# Patient Record
Sex: Female | Born: 1965 | Race: White | Hispanic: No | Marital: Married | Smoking: Never smoker
Health system: Southern US, Community
[De-identification: ages and names within clinical notes are randomized; demographics above are authoritative.]

## PROBLEM LIST (undated history)

## (undated) DIAGNOSIS — J45909 Unspecified asthma, uncomplicated: Secondary | ICD-10-CM

## (undated) DIAGNOSIS — F329 Major depressive disorder, single episode, unspecified: Secondary | ICD-10-CM

## (undated) DIAGNOSIS — R011 Cardiac murmur, unspecified: Secondary | ICD-10-CM

## (undated) DIAGNOSIS — J439 Emphysema, unspecified: Secondary | ICD-10-CM

## (undated) DIAGNOSIS — T7840XA Allergy, unspecified, initial encounter: Secondary | ICD-10-CM

## (undated) DIAGNOSIS — F32A Depression, unspecified: Secondary | ICD-10-CM

## (undated) DIAGNOSIS — E079 Disorder of thyroid, unspecified: Secondary | ICD-10-CM

## (undated) DIAGNOSIS — E785 Hyperlipidemia, unspecified: Secondary | ICD-10-CM

## (undated) DIAGNOSIS — R32 Unspecified urinary incontinence: Secondary | ICD-10-CM

## (undated) HISTORY — DX: Unspecified urinary incontinence: R32

## (undated) HISTORY — DX: Disorder of thyroid, unspecified: E07.9

## (undated) HISTORY — DX: Allergy, unspecified, initial encounter: T78.40XA

## (undated) HISTORY — DX: Hyperlipidemia, unspecified: E78.5

## (undated) HISTORY — DX: Major depressive disorder, single episode, unspecified: F32.9

## (undated) HISTORY — DX: Cardiac murmur, unspecified: R01.1

## (undated) HISTORY — DX: Unspecified asthma, uncomplicated: J45.909

## (undated) HISTORY — DX: Depression, unspecified: F32.A

## (undated) HISTORY — DX: Emphysema, unspecified: J43.9

---

## 2008-06-06 ENCOUNTER — Inpatient Hospital Stay: Payer: Self-pay

## 2009-03-14 ENCOUNTER — Encounter: Payer: Self-pay | Admitting: Maternal and Fetal Medicine

## 2009-03-31 ENCOUNTER — Encounter: Payer: Self-pay | Admitting: Maternal and Fetal Medicine

## 2009-06-27 ENCOUNTER — Observation Stay: Payer: Self-pay | Admitting: Obstetrics and Gynecology

## 2009-06-28 ENCOUNTER — Ambulatory Visit: Payer: Self-pay | Admitting: Obstetrics and Gynecology

## 2009-08-05 ENCOUNTER — Inpatient Hospital Stay: Payer: Self-pay

## 2009-08-31 HISTORY — PX: BREAST SURGERY: SHX581

## 2014-11-30 LAB — HM PAP SMEAR: HM PAP: NORMAL

## 2014-11-30 LAB — HM MAMMOGRAPHY: HM MAMMO: NORMAL

## 2014-12-18 LAB — HM PAP SMEAR: HM PAP: NEGATIVE

## 2015-08-05 ENCOUNTER — Ambulatory Visit: Payer: Self-pay | Admitting: Family Medicine

## 2015-08-08 ENCOUNTER — Encounter: Payer: Self-pay | Admitting: Family Medicine

## 2015-08-08 ENCOUNTER — Ambulatory Visit (INDEPENDENT_AMBULATORY_CARE_PROVIDER_SITE_OTHER): Payer: BLUE CROSS/BLUE SHIELD | Admitting: Family Medicine

## 2015-08-08 VITALS — BP 142/87 | HR 80 | Temp 98.6°F | Resp 16 | Ht 62.0 in | Wt 155.8 lb

## 2015-08-08 DIAGNOSIS — F419 Anxiety disorder, unspecified: Secondary | ICD-10-CM | POA: Insufficient documentation

## 2015-08-08 DIAGNOSIS — E559 Vitamin D deficiency, unspecified: Secondary | ICD-10-CM | POA: Diagnosis not present

## 2015-08-08 DIAGNOSIS — F331 Major depressive disorder, recurrent, moderate: Secondary | ICD-10-CM

## 2015-08-08 DIAGNOSIS — Z823 Family history of stroke: Secondary | ICD-10-CM

## 2015-08-08 DIAGNOSIS — R030 Elevated blood-pressure reading, without diagnosis of hypertension: Secondary | ICD-10-CM

## 2015-08-08 DIAGNOSIS — R002 Palpitations: Secondary | ICD-10-CM | POA: Insufficient documentation

## 2015-08-08 DIAGNOSIS — Z91048 Other nonmedicinal substance allergy status: Secondary | ICD-10-CM | POA: Diagnosis not present

## 2015-08-08 DIAGNOSIS — R6889 Other general symptoms and signs: Secondary | ICD-10-CM | POA: Diagnosis not present

## 2015-08-08 DIAGNOSIS — J4540 Moderate persistent asthma, uncomplicated: Secondary | ICD-10-CM | POA: Diagnosis not present

## 2015-08-08 DIAGNOSIS — F32A Depression, unspecified: Secondary | ICD-10-CM | POA: Insufficient documentation

## 2015-08-08 DIAGNOSIS — IMO0001 Reserved for inherently not codable concepts without codable children: Secondary | ICD-10-CM | POA: Insufficient documentation

## 2015-08-08 DIAGNOSIS — Z9109 Other allergy status, other than to drugs and biological substances: Secondary | ICD-10-CM | POA: Insufficient documentation

## 2015-08-08 MED ORDER — FLUTICASONE PROPIONATE HFA 220 MCG/ACT IN AERO: 2.0000 | INHALATION_SPRAY | Freq: Two times a day (BID) | RESPIRATORY_TRACT | Status: DC

## 2015-08-08 MED ORDER — PREDNISONE 20 MG PO TABS: 20.0000 mg | ORAL_TABLET | Freq: Every day | ORAL | Status: DC

## 2015-08-08 MED ORDER — SERTRALINE HCL 50 MG PO TABS: 50.0000 mg | ORAL_TABLET | Freq: Every day | ORAL | Status: DC

## 2015-08-08 MED ORDER — ALBUTEROL SULFATE HFA 108 (90 BASE) MCG/ACT IN AERS: 2.0000 | INHALATION_SPRAY | Freq: Four times a day (QID) | RESPIRATORY_TRACT | Status: DC | PRN

## 2015-08-08 NOTE — Assessment & Plan Note (Signed)
Continue nasacort and claritin. Suspect allergies causing asthma exacerbation. Recommend hepa filters at home. Avoid pets in the bedroom.  Consider referral to allergist.

## 2015-08-08 NOTE — Assessment & Plan Note (Signed)
Occasional. Cardiac work-up negative. If increasing- consider cardiology visit for addition work-up. Alarm symptoms reviewed.

## 2015-08-08 NOTE — Progress Notes (Signed)
Subjective:    Patient ID: Karen Martinez, female    DOB: 07/16/1966, 49 y.o.   MRN: [id number]  HPI: Karen Martinez is a 49 y.o. female presenting on 08/08/2015 for Establish Care   HPI  Pt presents to re-establish care today.Last seen here 2 years ago for an acute visit only. She was previously getting most care at  [facility].  It has been 6 months since Her last PCP visit. Records from previous provider will be requested and reviewed. Current medical problems include:  Seasonal allergies: Takes claritin daily. Controls most symptoms. Allergic to cats. Issues with pollen Breathing: No formal diagnosis of asthma Recently remodeling home- has been having issues with breathing. Daily inhaler use.  2 mos of coughing and chest tightness.  Issues began in October Depression: Low level dysthmia. Never been on medication for depression. Is reporting feeling down depressed or hopeless. Is reporting thoughts she would be better off dead. No active plan.  She thought these thoughts were normal.  She reports always having them. Never contemplated acting on thoughts.  Vitamin D deficiency: Taking 11914 per week 3 mos ago. Had low vitamin D in the pas.t  Back pain- Bulging disc- imaging done by chiropractor. Currently being treated by a chiropractor.  Heart arrhythmia:Started in March 2013 with palpitations- work up by [facility] cardiology. Had an Echo- done. Normal. PVCs found on exam. Has occasional palpitations Health maintenance:  Last mammogram April 2016- normal Last Pap APril 2016- normal Tdap: unsure Flu shot:   Past Medical History  Diagnosis Date  . Allergy   . Asthma   . Depression   . Emphysema of lung (HCC)   . Heart murmur     heart arrhythmias  . Hyperlipidemia   . Thyroid disease   . Urine incontinence    Social History   Social History  . Marital Status: Married    Spouse Name: N/A  . Number of Children: N/A  . Years of Education: N/A   Occupational History  . Not on  file.   Social History Main Topics  . Smoking status: Never Smoker   . Smokeless tobacco: Not on file  . Alcohol Use: Yes  . Drug Use: No  . Sexual Activity: Not on file   Other Topics Concern  . Not on file   Social History Narrative  . No narrative on file   Family History  Problem Relation Age of Onset  . Stroke Father   . Heart disease Father   . Cancer Father     facial   . Hyperlipidemia Father   . Cancer Maternal Grandfather   . Heart disease Maternal Grandfather   . Alcohol abuse Maternal Grandfather   . Alcohol abuse Paternal Grandfather    No current outpatient prescriptions on file prior to visit.   No current facility-administered medications on file prior to visit.    Review of Systems  Constitutional: Negative for fever and chills.  HENT: Positive for congestion and sneezing. Negative for nosebleeds, postnasal drip, sinus pressure and trouble swallowing.   Respiratory: Positive for cough, chest tightness and wheezing. Negative for shortness of breath.   Cardiovascular: Positive for palpitations (occasional. None currently. ). Negative for chest pain and leg swelling.  Endocrine: Positive for cold intolerance. Negative for polydipsia, polyphagia and polyuria.  Allergic/Immunologic: Positive for environmental allergies.  Neurological: Negative for dizziness, seizures and syncope.  Psychiatric/Behavioral: Positive for suicidal ideas (dark thoughts, no active plan. ) and dysphoric mood.  Per HPI unless specifically indicated above     Objective:    BP 142/87 mmHg  Pulse 80  Temp(Src) 98.6 F (37 C) (Oral)  Resp 16  Ht  (1.575 m)  Wt 155 lb 12.8 oz (70.67 kg)  BMI 28.49 kg/m2  LMP 06/16/2015  Wt Readings from Last 3 Encounters:  08/08/15 155 lb 12.8 oz (70.67 kg)    Physical Exam  Constitutional: She is oriented to person, place, and time. She appears well-developed and well-nourished.  HENT:  Head: Normocephalic and atraumatic.  Neck:  Neck supple.  Cardiovascular: Normal rate, regular rhythm and normal heart sounds.  Exam reveals no gallop and no friction rub.   No murmur heard. Pulmonary/Chest: Effort normal and breath sounds normal. She has no wheezes. She exhibits no tenderness.  Abdominal: Soft. Normal appearance and bowel sounds are normal. She exhibits no distension and no mass. There is no tenderness. There is no rebound and no guarding.  Musculoskeletal: Normal range of motion. She exhibits no edema or tenderness.  Lymphadenopathy:    She has no cervical adenopathy.  Neurological: She is alert and oriented to person, place, and time.  Skin: Skin is warm and dry.  Psychiatric: She has a normal mood and affect. Her speech is normal and behavior is normal. Judgment normal. Thought content is not paranoid. Cognition and memory are normal. She expresses no suicidal ideation. She expresses no suicidal plans and no homicidal plans.   Results for orders placed or performed in visit on 08/08/15  HM MAMMOGRAPHY  Result Value Ref Range   HM Mammogram normal   HM PAP SMEAR  Result Value Ref Range   HM Pap smear normal       Assessment & Plan:   Problem List Items Addressed This Visit      Respiratory   Moderate persistent asthma - Primary    Likely asthma exacerbation.  Cough variant asthma. Treat with PRN albuterol. Start controller medication.   CBC and IgE testing- suspect allergic component. Spirometry at next visit.  Short burst prednisone to treat for acute exacerbation today.  Spirometry needed next visit.       Relevant Medications   fluticasone (FLOVENT HFA) 220 MCG/ACT inhaler   albuterol (PROVENTIL HFA;VENTOLIN HFA) 108 (90 BASE) MCG/ACT inhaler   predniSONE (DELTASONE) 20 MG tablet   Other Relevant Orders   Comprehensive Metabolic Panel (CMET)   CBC with Differential   IgE     Other   Moderate recurrent major depression (HCC)    PHQ9 16/27- likely dysthimia considering continuous symptoms.  Start zoloft today.   Check TSH and Vitamin D. Dark thoughts:  Safety contract made. Pt aware medication may increase these thoughts- she will call the office right away if they occur. ER if danger to self. National Suicide prevention line given.       Relevant Medications   sertraline (ZOLOFT) 50 MG tablet   Vitamin D deficiency    Recheck vitamin D level.       Relevant Orders   VITAMIN D 25 Hydroxy (Vit-D Deficiency, Fractures)   Elevated BP    Recheck BP at home x 3 week. Follow-up visit for BP. DASH diet reviewed. Alarm symptoms reviewed.  CMP      Palpitations    Occasional. Cardiac work-up negative. If increasing- consider cardiology visit for addition work-up. Alarm symptoms reviewed.       Multiple environmental allergies    Continue nasacort and claritin. Suspect allergies causing asthma exacerbation. Recommend  hepa filters at home. Avoid pets in the bedroom.  Consider referral to allergist.        Other Visit Diagnoses    Cold intolerance        Check TSH.     Relevant Orders    TSH    Family history of stroke        Lipid panel to stratify risk factors    Relevant Orders    Lipid Profile       Meds ordered this encounter  Medications  . NUVARING 0.12-0.015 MG/24HR vaginal ring    Sig: INSERT A NUVARING INTO VAGINA, LEAVE IN PLACE 3 (THREE) WEEKS AND REMOVE FOR 1 WEEK.    Refill:  3  . vitamin C (ASCORBIC ACID) 500 MG tablet    Sig: Take 500 mg by mouth daily.  . calcium-vitamin D (OSCAL WITH D) 250-125 MG-UNIT tablet    Sig: Take 1 tablet by mouth daily.  . naproxen (NAPROSYN) 500 MG tablet    Sig: Take 500 mg by mouth 2 (two) times daily with a meal.  . loratadine (CLARITIN) 10 MG tablet    Sig: Take 10 mg by mouth daily.  Marland Kitchen. triamcinolone (NASACORT AQ) 55 MCG/ACT AERO nasal inhaler    Sig: Place 2 sprays into the nose daily.  . fluticasone (FLOVENT HFA) 220 MCG/ACT inhaler    Sig: Inhale 2 puffs into the lungs 2 (two) times daily.    Dispense:   1 Inhaler    Refill:  12    Order Specific Question:  Supervising Provider    Answer:  Janeann ForehandHAWKINS JR, JAMES H [phone][[id number]]  . albuterol (PROVENTIL HFA;VENTOLIN HFA) 108 (90 BASE) MCG/ACT inhaler    Sig: Inhale 2 puffs into the lungs every 6 (six) hours as needed for wheezing or shortness of breath.    Dispense:  1 Inhaler    Refill:  11    Order Specific Question:  Supervising Provider    Answer:  Janeann ForehandHAWKINS JR, JAMES H [phone][[id number]]  . predniSONE (DELTASONE) 20 MG tablet    Sig: Take 1 tablet (20 mg total) by mouth daily with breakfast.    Dispense:  10 tablet    Refill:  0    Order Specific Question:  Supervising Provider    Answer:  Janeann ForehandHAWKINS JR, JAMES H [phone][[id number]]  . sertraline (ZOLOFT) 50 MG tablet    Sig: Take 1 tablet (50 mg total) by mouth daily.    Dispense:  30 tablet    Refill:  3    Order Specific Question:  Supervising Provider    Answer:  Janeann ForehandHAWKINS JR, JAMES H [[id number]]      Follow up plan: Return in about 4 weeks (around 09/05/2015) for spirometry, depression, bp.

## 2015-08-08 NOTE — Patient Instructions (Signed)
Blood pressure: Check three times per week after sitting down for 5 minutes. We will follow-up in 1 month for your blood pressure.  Depression: Start Zoloft after you finish the prednisone. Break the first 2 tablets in 1/2 and take 1/2 pill for the first 4 days. Take this at bedtime. It takes a full month for the medication to take effect. We will follow-up in 1 month.  The Zoloft might increase your dark thoughts- if it does, please let me know. If you feel you are a danger to yourself, go straight to the ER.   You can also call  [phone]  Breathing: Take 5 days of prednisone to help with symptoms. Take albuterol PRN q4-6 hours. Start steroid inhaler twice daily. We will follow-up in 1 month and do a breathing test. Please bring your inhaler.  Please seek immediate medical attention if you develop shortness of breath not relieve by inhaler, chest pain/tightness, fever > 103 F or other concerning symptoms.

## 2015-08-08 NOTE — Assessment & Plan Note (Signed)
PHQ9 16/27- likely dysthimia considering continuous symptoms. Start zoloft today.   Check TSH and Vitamin D. Dark thoughts:  Safety contract made. Pt aware medication may increase these thoughts- she will call the office right away if they occur. ER if danger to self. National Suicide prevention line given.

## 2015-08-08 NOTE — Assessment & Plan Note (Addendum)
Likely asthma exacerbation.  Cough variant asthma. Treat with PRN albuterol. Start controller medication.   CBC and IgE testing- suspect allergic component. Spirometry at next visit.  Short burst prednisone to treat for acute exacerbation today.  Spirometry needed next visit.

## 2015-08-08 NOTE — Assessment & Plan Note (Signed)
Recheck BP at home x 3 week. Follow-up visit for BP. DASH diet reviewed. Alarm symptoms reviewed.  CMP

## 2015-08-08 NOTE — Assessment & Plan Note (Signed)
Recheck vitamin D level 

## 2015-08-20 LAB — CBC WITH DIFFERENTIAL/PLATELET
BASOS: 1 %
Basophils Absolute: 0.1 10*3/uL (ref 0.0–0.2)
EOS (ABSOLUTE): 0.4 10*3/uL (ref 0.0–0.4)
EOS: 3 %
HEMATOCRIT: 39.5 % (ref 34.0–46.6)
HEMOGLOBIN: 13.2 g/dL (ref 11.1–15.9)
IMMATURE GRANULOCYTES: 0 %
Immature Grans (Abs): 0 10*3/uL (ref 0.0–0.1)
Lymphocytes Absolute: 5 10*3/uL — ABNORMAL HIGH (ref 0.7–3.1)
Lymphs: 40 %
MCH: 29.7 pg (ref 26.6–33.0)
MCHC: 33.4 g/dL (ref 31.5–35.7)
MCV: 89 fL (ref 79–97)
MONOCYTES: 5 %
Monocytes Absolute: 0.7 10*3/uL (ref 0.1–0.9)
NEUTROS PCT: 51 %
Neutrophils Absolute: 6.3 10*3/uL (ref 1.4–7.0)
Platelets: 373 10*3/uL (ref 150–379)
RBC: 4.45 x10E6/uL (ref 3.77–5.28)
RDW: 13.5 % (ref 12.3–15.4)
WBC: 12.5 10*3/uL — AB (ref 3.4–10.8)

## 2015-08-21 LAB — LIPID PANEL
Chol/HDL Ratio: 4.5 ratio units — ABNORMAL HIGH (ref 0.0–4.4)
Cholesterol, Total: 213 mg/dL — ABNORMAL HIGH (ref 100–199)
HDL: 47 mg/dL (ref >39)
LDL CALC: 115 mg/dL — AB (ref 0–99)
Triglycerides: 253 mg/dL — ABNORMAL HIGH (ref 0–149)
VLDL CHOLESTEROL CAL: 51 mg/dL — AB (ref 5–40)

## 2015-08-21 LAB — COMPREHENSIVE METABOLIC PANEL
ALBUMIN: 4.1 g/dL (ref 3.5–5.5)
ALK PHOS: 51 IU/L (ref 39–117)
ALT: 13 IU/L (ref 0–32)
AST: 12 IU/L (ref 0–40)
Albumin/Globulin Ratio: 1.6 (ref 1.1–2.5)
BILIRUBIN TOTAL: 0.3 mg/dL (ref 0.0–1.2)
BUN / CREAT RATIO: 13 (ref 9–23)
BUN: 9 mg/dL (ref 6–24)
CHLORIDE: 101 mmol/L (ref 96–106)
CO2: 25 mmol/L (ref 18–29)
Calcium: 9.2 mg/dL (ref 8.7–10.2)
Creatinine, Ser: 0.7 mg/dL (ref 0.57–1.00)
GFR calc Af Amer: 119 mL/min/{1.73_m2} (ref >59)
GFR calc non Af Amer: 104 mL/min/{1.73_m2} (ref >59)
GLOBULIN, TOTAL: 2.6 g/dL (ref 1.5–4.5)
GLUCOSE: 80 mg/dL (ref 65–99)
Potassium: 4.2 mmol/L (ref 3.5–5.2)
SODIUM: 140 mmol/L (ref 134–144)
Total Protein: 6.7 g/dL (ref 6.0–8.5)

## 2015-08-21 LAB — TSH: TSH: 3.69 u[IU]/mL (ref 0.450–4.500)

## 2015-08-21 LAB — IGE: IgE (Immunoglobulin E), Serum: 33 IU/mL (ref 0–100)

## 2015-08-21 LAB — VITAMIN D 25 HYDROXY (VIT D DEFICIENCY, FRACTURES): VIT D 25 HYDROXY: 28.6 ng/mL — AB (ref 30.0–100.0)

## 2015-09-17 ENCOUNTER — Ambulatory Visit (INDEPENDENT_AMBULATORY_CARE_PROVIDER_SITE_OTHER): Payer: BLUE CROSS/BLUE SHIELD | Admitting: Family Medicine

## 2015-09-17 VITALS — BP 123/81 | HR 71 | Temp 98.8°F | Resp 16 | Ht 62.0 in | Wt 155.6 lb

## 2015-09-17 DIAGNOSIS — D72829 Elevated white blood cell count, unspecified: Secondary | ICD-10-CM | POA: Diagnosis not present

## 2015-09-17 DIAGNOSIS — J4540 Moderate persistent asthma, uncomplicated: Secondary | ICD-10-CM | POA: Diagnosis not present

## 2015-09-17 DIAGNOSIS — R030 Elevated blood-pressure reading, without diagnosis of hypertension: Secondary | ICD-10-CM | POA: Diagnosis not present

## 2015-09-17 DIAGNOSIS — IMO0001 Reserved for inherently not codable concepts without codable children: Secondary | ICD-10-CM

## 2015-09-17 DIAGNOSIS — F331 Major depressive disorder, recurrent, moderate: Secondary | ICD-10-CM

## 2015-09-17 MED ORDER — FISH OIL 1000 MG PO CAPS: 1.0000 | ORAL_CAPSULE | Freq: Every day | ORAL | Status: DC

## 2015-09-17 MED ORDER — FLUOXETINE HCL 20 MG PO TABS: 20.0000 mg | ORAL_TABLET | Freq: Every day | ORAL | Status: DC

## 2015-09-17 NOTE — Assessment & Plan Note (Signed)
Controlled at home and in the office. Will continue to monitor closely.

## 2015-09-17 NOTE — Assessment & Plan Note (Signed)
Symptoms of exacerbation resolved with prednisone. Pt advised to use PRN inhaler as needed. Can step down Flovent if she feels her breathing is improved.  Alarm symptoms reviewed.

## 2015-09-17 NOTE — Patient Instructions (Addendum)
Let try Prozac for your depression to see if you do better.  Try breaking the tablet in 1/2 for the first 4 days to cut down on side effects. Fluoxetine capsules or tablets (Depression/Mood Disorders) What is this medicine? FLUOXETINE (floo OX e teen) belongs to a class of drugs known as selective serotonin reuptake inhibitors (SSRIs). It helps to treat mood problems such as depression, obsessive compulsive disorder, and panic attacks. It can also treat certain eating disorders. This medicine may be used for other purposes; ask your health care provider or pharmacist if you have questions. What should I tell my health care provider before I take this medicine? They need to know if you have any of these conditions: -bipolar disorder or mania -diabetes -glaucoma -liver disease -psychosis -seizures -suicidal thoughts or history of attempted suicide -an unusual or allergic reaction to fluoxetine, other medicines, foods, dyes, or preservatives -pregnant or trying to get pregnant -breast-feeding How should I use this medicine? Take this medicine by mouth with a glass of water. Follow the directions on the prescription label. You can take this medicine with or without food. Take your medicine at regular intervals. Do not take it more often than directed. Do not stop taking this medicine suddenly except upon the advice of your doctor. Stopping this medicine too quickly may cause serious side effects or your condition may worsen. A special MedGuide will be given to you by the pharmacist with each prescription and refill. Be sure to read this information carefully each time. Talk to your pediatrician regarding the use of this medicine in children. While this drug may be prescribed for children as young as 7 years for selected conditions, precautions do apply. Overdosage: If you think you have taken too much of this medicine contact a poison control center or emergency room at once. NOTE: This medicine is  only for you. Do not share this medicine with others. What if I miss a dose? If you miss a dose, skip the missed dose and go back to your regular dosing schedule. Do not take double or extra doses. What may interact with this medicine? Do not take fluoxetine with any of the following medications: -other medicines containing fluoxetine, like Sarafem or Symbyax -cisapride -linezolid -MAOIs like Carbex, Eldepryl, Marplan, Nardil, and Parnate -methylene blue (injected into a vein) -pimozide -thioridazine This medicine may also interact with the following medications: -alcohol -aspirin and aspirin-like medicines -carbamazepine -certain medicines for depression, anxiety, or psychotic disturbances -certain medicines for migraine headaches like almotriptan, eletriptan, frovatriptan, naratriptan, rizatriptan, sumatriptan, zolmitriptan -digoxin -diuretics -fentanyl -flecainide -furazolidone -isoniazid -lithium -medicines for sleep -medicines that treat or prevent blood clots like warfarin, enoxaparin, and dalteparin -NSAIDs, medicines for pain and inflammation, like ibuprofen or naproxen -phenytoin -procarbazine -propafenone -rasagiline -ritonavir -supplements like St. John's wort, kava kava, valerian -tramadol -tryptophan -vinblastine This list may not describe all possible interactions. Give your health care provider a list of all the medicines, herbs, non-prescription drugs, or dietary supplements you use. Also tell them if you smoke, drink alcohol, or use illegal drugs. Some items may interact with your medicine. What should I watch for while using this medicine? Tell your doctor if your symptoms do not get better or if they get worse. Visit your doctor or health care professional for regular checks on your progress. Because it may take several weeks to see the full effects of this medicine, it is important to continue your treatment as prescribed by your doctor. Patients and their  families should watch  out for new or worsening thoughts of suicide or depression. Also watch out for sudden changes in feelings such as feeling anxious, agitated, panicky, irritable, hostile, aggressive, impulsive, severely restless, overly excited and hyperactive, or not being able to sleep. If this happens, especially at the beginning of treatment or after a change in dose, call your health care professional. Karen Martinez may get drowsy or dizzy. Do not drive, use machinery, or do anything that needs mental alertness until you know how this medicine affects you. Do not stand or sit up quickly, especially if you are an older patient. This reduces the risk of dizzy or fainting spells. Alcohol may interfere with the effect of this medicine. Avoid alcoholic drinks. Your mouth may get dry. Chewing sugarless gum or sucking hard candy, and drinking plenty of water may help. Contact your doctor if the problem does not go away or is severe. This medicine may affect blood sugar levels. If you have diabetes, check with your doctor or health care professional before you change your diet or the dose of your diabetic medicine. What side effects may I notice from receiving this medicine? Side effects that you should report to your doctor or health care professional as soon as possible: -allergic reactions like skin rash, itching or hives, swelling of the face, lips, or tongue -breathing problems -confusion -eye pain, changes in vision -fast or irregular heart rate, palpitations -flu-like fever, chills, cough, muscle or joint aches and pains -seizures -suicidal thoughts or other mood changes -swelling or redness in or around the eye -tremors -trouble sleeping -unusual bleeding or bruising -unusually tired or weak -vomiting Side effects that usually do not require medical attention (report to your doctor or health care professional if they continue or are bothersome): -change in sex drive or  performance -diarrhea -dry mouth -flushing -headache -increased or decreased appetite -nausea -sweating This list may not describe all possible side effects. Call your doctor for medical advice about side effects. You may report side effects to FDA at 1-800-FDA-1088. Where should I keep my medicine? Keep out of the reach of children. Store at room temperature between 15 and 30 degrees C (59 and 86 degrees F). Throw away any unused medicine after the expiration date. NOTE: This sheet is a summary. It may not cover all possible information. If you have questions about this medicine, talk to your doctor, pharmacist, or health care provider.    2016, [employer]vier/Gold Standard. (2014-08-10 12:40:07)

## 2015-09-17 NOTE — Progress Notes (Signed)
Subjective:    Patient ID: Karen Martinez, female    DOB: 03/15/1966, 50 y.o.   MRN: [id number]  HPI: Karen Martinez is a 50 y.o. female presenting on 09/17/2015 for Hypertension; Depression; and Asthma   HPI  Pt presents for blood pressure follow-up. Elevated at last visit.  Blood pressure is doing well. Checked at home. Avg 129/78- 126/73.   Depression: Tried zoloft- had horrible headaches- stopped medication. She did feel better while on it. Is desiring to try a different medication. Low mood, low energy. No SI or HI. Felt she was better able to cope when on medication.  Asthma: Breathing is doing well. Much improved since prednisone. Is not taking Flovent daily. Using rescue inhaler PRN.   Past Medical History  Diagnosis Date  . Allergy   . Asthma   . Depression   . Emphysema of lung (HCC)   . Heart murmur     heart arrhythmias  . Hyperlipidemia   . Thyroid disease   . Urine incontinence     Current Outpatient Prescriptions on File Prior to Visit  Medication Sig  . albuterol (PROVENTIL HFA;VENTOLIN HFA) 108 (90 BASE) MCG/ACT inhaler Inhale 2 puffs into the lungs every 6 (six) hours as needed for wheezing or shortness of breath.  . calcium-vitamin D (OSCAL WITH D) 250-125 MG-UNIT tablet Take 1 tablet by mouth daily.  . fluticasone (FLOVENT HFA) 220 MCG/ACT inhaler Inhale 2 puffs into the lungs 2 (two) times daily.  Marland Kitchen loratadine (CLARITIN) 10 MG tablet Take 10 mg by mouth daily.  . naproxen (NAPROSYN) 500 MG tablet Take 500 mg by mouth 2 (two) times daily with a meal.  . NUVARING 0.12-0.015 MG/24HR vaginal ring INSERT A NUVARING INTO VAGINA, LEAVE IN PLACE 3 (THREE) WEEKS AND REMOVE FOR 1 WEEK.  . triamcinolone (NASACORT AQ) 55 MCG/ACT AERO nasal inhaler Place 2 sprays into the nose daily.  . vitamin C (ASCORBIC ACID) 500 MG tablet Take 500 mg by mouth daily.   No current facility-administered medications on file prior to visit.    Review of Systems  Constitutional:  Negative for fever and chills.  HENT: Negative.   Respiratory: Negative for cough, chest tightness and wheezing.   Cardiovascular: Negative for chest pain and leg swelling.  Gastrointestinal: Negative for nausea, vomiting, abdominal pain, diarrhea and constipation.  Endocrine: Negative.  Negative for cold intolerance, heat intolerance, polydipsia, polyphagia and polyuria.  Genitourinary: Negative for dysuria and difficulty urinating.  Musculoskeletal: Negative.   Neurological: Negative for dizziness, light-headedness and numbness.  Psychiatric/Behavioral: Negative.    Per HPI unless specifically indicated above     Objective:    BP 123/81 mmHg  Pulse 71  Temp(Src) 98.8 F (37.1 C) (Oral)  Resp 16  Ht  (1.575 m)  Wt 155 lb 9.6 oz (70.58 kg)  BMI 28.45 kg/m2  Wt Readings from Last 3 Encounters:  09/17/15 155 lb 9.6 oz (70.58 kg)  08/08/15 155 lb 12.8 oz (70.67 kg)    Depression screen [facility] 2/9 09/17/2015 09/17/2015 08/08/2015  Decreased Interest 0 0 3  Down, Depressed, Hopeless 0 0 1  PHQ - 2 Score 0 0 4  Altered sleeping 1 - 1  Tired, decreased energy 1 - 3  Change in appetite 1 - 2  Feeling bad or failure about yourself  1 - 2  Trouble concentrating 1 - 2  Moving slowly or fidgety/restless 0 - 0  Suicidal thoughts 0 - 2  PHQ-9 Score 5 - 16  Difficult doing work/chores Somewhat difficult - Very difficult    Physical Exam  Constitutional: She is oriented to person, place, and time. She appears well-developed and well-nourished.  HENT:  Head: Normocephalic and atraumatic.  Neck: Neck supple.  Cardiovascular: Normal rate, regular rhythm and normal heart sounds.  Exam reveals no gallop and no friction rub.   No murmur heard. Pulmonary/Chest: Effort normal and breath sounds normal. She has no wheezes. She exhibits no tenderness.  Abdominal: Soft. Normal appearance and bowel sounds are normal. She exhibits no distension and no mass. There is no tenderness. There is no  rebound and no guarding.  Musculoskeletal: Normal range of motion. She exhibits no edema or tenderness.  Lymphadenopathy:    She has no cervical adenopathy.  Neurological: She is alert and oriented to person, place, and time.  Skin: Skin is warm and dry.  Psychiatric: She has a normal mood and affect. Her behavior is normal. Judgment and thought content normal.   Results for orders placed or performed in visit on 08/08/15  HM MAMMOGRAPHY  Result Value Ref Range   HM Mammogram normal   HM PAP SMEAR  Result Value Ref Range   HM Pap smear normal   Lipid Profile  Result Value Ref Range   Cholesterol, Total 213 (H) 100 - 199 mg/dL   Triglycerides 841 (H) 0 - 149 mg/dL   HDL 47 >32 mg/dL   VLDL Cholesterol Cal 51 (H) 5 - 40 mg/dL   LDL Calculated 440 (H) 0 - 99 mg/dL   Chol/HDL Ratio 4.5 (H) 0.0 - 4.4 ratio units  Comprehensive Metabolic Panel (CMET)  Result Value Ref Range   Glucose 80 65 - 99 mg/dL   BUN 9 6 - 24 mg/dL   Creatinine, Ser 1.02 0.57 - 1.00 mg/dL   GFR calc non Af Amer 104 >59 mL/min/1.73   GFR calc Af Amer 119 >59 mL/min/1.73   BUN/Creatinine Ratio 13 9 - 23   Sodium 140 134 - 144 mmol/L   Potassium 4.2 3.5 - 5.2 mmol/L   Chloride 101 96 - 106 mmol/L   CO2 25 18 - 29 mmol/L   Calcium 9.2 8.7 - 10.2 mg/dL   Total Protein 6.7 6.0 - 8.5 g/dL   Albumin 4.1 3.5 - 5.5 g/dL   Globulin, Total 2.6 1.5 - 4.5 g/dL   Albumin/Globulin Ratio 1.6 1.1 - 2.5   Bilirubin Total 0.3 0.0 - 1.2 mg/dL   Alkaline Phosphatase 51 39 - 117 IU/L   AST 12 0 - 40 IU/L   ALT 13 0 - 32 IU/L  VITAMIN D 25 Hydroxy (Vit-D Deficiency, Fractures)  Result Value Ref Range   Vit D, 25-Hydroxy 28.6 (L) 30.0 - 100.0 ng/mL  TSH  Result Value Ref Range   TSH 3.690 0.450 - 4.500 uIU/mL  CBC with Differential  Result Value Ref Range   WBC 12.5 (H) 3.4 - 10.8 x10E3/uL   RBC 4.45 3.77 - 5.28 x10E6/uL   Hemoglobin 13.2 11.1 - 15.9 g/dL   Hematocrit 72.5 36.6 - 46.6 %   MCV 89 79 - 97 fL   MCH 29.7  26.6 - 33.0 pg   MCHC 33.4 31.5 - 35.7 g/dL   RDW 44.0 34.7 - 42.5 %   Platelets 373 150 - 379 x10E3/uL   Neutrophils 51 %   Lymphs 40 %   Monocytes 5 %   Eos 3 %   Basos 1 %   Neutrophils Absolute 6.3 1.4 - 7.0 x10E3/uL  Lymphocytes Absolute 5.0 (H) 0.7 - 3.1 x10E3/uL   Monocytes Absolute 0.7 0.1 - 0.9 x10E3/uL   EOS (ABSOLUTE) 0.4 0.0 - 0.4 x10E3/uL   Basophils Absolute 0.1 0.0 - 0.2 x10E3/uL   Immature Granulocytes 0 %   Immature Grans (Abs) 0.0 0.0 - 0.1 x10E3/uL  IgE  Result Value Ref Range   IgE (Immunoglobulin E), Serum 33 0 - 100 IU/mL      Assessment & Plan:   Problem List Items Addressed This Visit      Respiratory   Moderate persistent asthma    Symptoms of exacerbation resolved with prednisone. Pt advised to use PRN inhaler as needed. Can step down Flovent if she feels her breathing is improved.  Alarm symptoms reviewed.         Other   Moderate recurrent major depression (HCC) - Primary    Pt did not tolerate Zoloft. Switch to Prozac daily to see if she can tolerate side effect profile. Encouraged 30 minutes of exercise daily. Trial of daily fish oil to see if mood can be improved.  Recheck 1 mos.       Relevant Medications   FLUoxetine (PROZAC) 20 MG tablet   Omega-3 Fatty Acids (FISH OIL) 1000 MG CAPS   Elevated BP    Controlled at home and in the office. Will continue to monitor closely.        Other Visit Diagnoses    Elevated white blood cell count        Recheck CBC. Pt had resolving infection at last labs.     Relevant Orders    CBC with Differential/Platelet       Meds ordered this encounter  Medications  . FLUoxetine (PROZAC) 20 MG tablet    Sig: Take 1 tablet (20 mg total) by mouth daily.    Dispense:  30 tablet    Refill:  11    Order Specific Question:  Supervising Provider    Answer:  Janeann Forehand [phone]  . Omega-3 Fatty Acids (FISH OIL) 1000 MG CAPS    Sig: Take 1 capsule (1,000 mg total) by mouth daily.    Dispense:   30 capsule    Refill:  0    Order Specific Question:  Supervising Provider    Answer:  Janeann Forehand [[id number]]      Follow up plan: Return in about 4 weeks (around 10/15/2015).

## 2015-09-17 NOTE — Assessment & Plan Note (Signed)
Pt did not tolerate Zoloft. Switch to Prozac daily to see if she can tolerate side effect profile. Encouraged 30 minutes of exercise daily. Trial of daily fish oil to see if mood can be improved.  Recheck 1 mos.

## 2015-09-26 ENCOUNTER — Telehealth: Payer: Self-pay | Admitting: Family Medicine

## 2015-09-26 DIAGNOSIS — F331 Major depressive disorder, recurrent, moderate: Secondary | ICD-10-CM

## 2015-09-26 MED ORDER — VENLAFAXINE HCL ER 37.5 MG PO CP24: ORAL_CAPSULE | ORAL | Status: DC

## 2015-09-26 NOTE — Telephone Encounter (Signed)
Change to Venlafaxine 37.5mg  once daily then increase to  for full effect. She can switch over today.

## 2015-09-26 NOTE — Telephone Encounter (Signed)
Pt called wanted to know if she can  Be winged off fluoxetine 20 mg  Pt call back # is  [phone] ( she was having a side effect of Medication, Not  Sleeping)

## 2015-09-26 NOTE — Telephone Encounter (Signed)
Oh no. I am sorry about that. Tell her to take 1/2 tablet for 4 days and then stop the medication. Would she like to try a different medication at this time? Thanks! AK

## 2015-09-26 NOTE — Telephone Encounter (Signed)
Patient has been notified of taper to d/c Fluoxetine. Patient would like to try something else.

## 2015-10-02 ENCOUNTER — Telehealth: Payer: Self-pay | Admitting: Family Medicine

## 2015-10-02 NOTE — Telephone Encounter (Signed)
Pt called states that she still have a rash was not sure what she need to to pt. Call back # is [phone]

## 2015-10-02 NOTE — Telephone Encounter (Signed)
She should make an appt to be seen. We should check it out. Thanks! AK

## 2015-10-02 NOTE — Telephone Encounter (Signed)
Spoke to the pt and offer an appointment but as per pt her Rash is improving willing to wait for couple of days and if not better then will call for appointment.

## 2015-10-15 ENCOUNTER — Ambulatory Visit: Payer: Self-pay | Admitting: Family Medicine

## 2015-10-21 ENCOUNTER — Ambulatory Visit (INDEPENDENT_AMBULATORY_CARE_PROVIDER_SITE_OTHER): Payer: BLUE CROSS/BLUE SHIELD | Admitting: Family Medicine

## 2015-10-21 ENCOUNTER — Encounter: Payer: Self-pay | Admitting: Family Medicine

## 2015-10-21 VITALS — BP 135/86 | HR 67 | Temp 99.4°F | Resp 16 | Ht 62.0 in | Wt 155.0 lb

## 2015-10-21 DIAGNOSIS — F331 Major depressive disorder, recurrent, moderate: Secondary | ICD-10-CM | POA: Diagnosis not present

## 2015-10-21 DIAGNOSIS — N898 Other specified noninflammatory disorders of vagina: Secondary | ICD-10-CM

## 2015-10-21 NOTE — Assessment & Plan Note (Signed)
Continue Prozac daily. Pt appears to be doing well. Rash was irritant related not medication.  Encouraged continued vitamin D. Encouraged 30 minutes of exercise.  Recheck in 3 mos.

## 2015-10-21 NOTE — Patient Instructions (Signed)
Let's continue your current dose of Prozac. Please let me know if you don't feel it's working and we can increase your dose.

## 2015-10-21 NOTE — Progress Notes (Signed)
Subjective:    Patient ID: Karen Martinez, female    DOB: 1965-11-02, 50 y.o.   MRN: [id number]  HPI: Karen Martinez is a 50 y.o. female presenting on 10/21/2015 for Depression   HPI  Pt presents for follow-up of depression medication. We have changed several times due to side effects. Started zoloft and did not tolerate- switched to prozac but thought it caused a rash, then tried effexor which caused excessive daytime sleepinss. Was tried tried on Effexor due to rash fluoxetine. It turns out the rash was related to pads. Restarted prozac after tapering off effexor due to sleepiness. Has been on Prozac again for 3 weeks. Feels her anxiety is much improved. Her "short fuse" is gone. Less irritable. Taking vitamin D to help with fatigue.    Past Medical History  Diagnosis Date  . Allergy   . Asthma   . Depression   . Emphysema of lung (HCC)   . Heart murmur     heart arrhythmias  . Hyperlipidemia   . Thyroid disease   . Urine incontinence     Current Outpatient Prescriptions on File Prior to Visit  Medication Sig  . albuterol (PROVENTIL HFA;VENTOLIN HFA) 108 (90 BASE) MCG/ACT inhaler Inhale 2 puffs into the lungs every 6 (six) hours as needed for wheezing or shortness of breath.  . calcium-vitamin D (OSCAL WITH D) 250-125 MG-UNIT tablet Take 1 tablet by mouth daily.  . fluticasone (FLOVENT HFA) 220 MCG/ACT inhaler Inhale 2 puffs into the lungs 2 (two) times daily.  Marland Kitchen loratadine (CLARITIN) 10 MG tablet Take 10 mg by mouth daily.  . naproxen (NAPROSYN) 500 MG tablet Take 500 mg by mouth 2 (two) times daily with a meal.  . NUVARING 0.12-0.015 MG/24HR vaginal ring INSERT A NUVARING INTO VAGINA, LEAVE IN PLACE 3 (THREE) WEEKS AND REMOVE FOR 1 WEEK.  . Omega-3 Fatty Acids (FISH OIL) 1000 MG CAPS Take 1 capsule (1,000 mg total) by mouth daily.  Marland Kitchen triamcinolone (NASACORT AQ) 55 MCG/ACT AERO nasal inhaler Place 2 sprays into the nose daily.  . vitamin C (ASCORBIC ACID) 500 MG tablet Take 500 mg  by mouth daily.   No current facility-administered medications on file prior to visit.    Review of Systems  Constitutional: Negative for fever and chills.  HENT: Negative.   Respiratory: Negative for cough, chest tightness and wheezing.   Cardiovascular: Negative for chest pain and leg swelling.  Gastrointestinal: Negative for nausea, vomiting, abdominal pain, diarrhea and constipation.  Endocrine: Negative.  Negative for cold intolerance, heat intolerance, polydipsia, polyphagia and polyuria.  Genitourinary: Negative for dysuria and difficulty urinating.  Musculoskeletal: Negative.   Neurological: Negative for dizziness, light-headedness and numbness.  Psychiatric/Behavioral: Negative.    Per HPI unless specifically indicated above     Objective:    BP 135/86 mmHg  Pulse 67  Temp(Src) 99.4 F (37.4 C) (Oral)  Resp 16  Ht  (1.575 m)  Wt 155 lb (70.308 kg)  BMI 28.34 kg/m2  LMP 10/16/2015  Wt Readings from Last 3 Encounters:  10/21/15 155 lb (70.308 kg)  09/17/15 155 lb 9.6 oz (70.58 kg)  08/08/15 155 lb 12.8 oz (70.67 kg)    Physical Exam  Constitutional: She is oriented to person, place, and time. She appears well-developed and well-nourished.  HENT:  Head: Normocephalic and atraumatic.  Neck: Neck supple.  Cardiovascular: Normal rate, regular rhythm and normal heart sounds.  Exam reveals no gallop and no friction rub.   No  murmur heard. Pulmonary/Chest: Effort normal and breath sounds normal. She has no wheezes. She exhibits no tenderness.  Abdominal: Soft. Normal appearance and bowel sounds are normal. She exhibits no distension and no mass. There is no tenderness. There is no rebound and no guarding.  Musculoskeletal: Normal range of motion. She exhibits no edema or tenderness.  Lymphadenopathy:    She has no cervical adenopathy.  Neurological: She is alert and oriented to person, place, and time.  Skin: Skin is warm and dry.  Psychiatric: She has a normal  mood and affect. Her behavior is normal. Judgment and thought content normal.   Results for orders placed or performed in visit on 08/08/15  HM MAMMOGRAPHY  Result Value Ref Range   HM Mammogram normal   HM PAP SMEAR  Result Value Ref Range   HM Pap smear normal   Lipid Profile  Result Value Ref Range   Cholesterol, Total 213 (H) 100 - 199 mg/dL   Triglycerides 956 (H) 0 - 149 mg/dL   HDL 47 >21 mg/dL   VLDL Cholesterol Cal 51 (H) 5 - 40 mg/dL   LDL Calculated 308 (H) 0 - 99 mg/dL   Chol/HDL Ratio 4.5 (H) 0.0 - 4.4 ratio units  Comprehensive Metabolic Panel (CMET)  Result Value Ref Range   Glucose 80 65 - 99 mg/dL   BUN 9 6 - 24 mg/dL   Creatinine, Ser 6.57 0.57 - 1.00 mg/dL   GFR calc non Af Amer 104 >59 mL/min/1.73   GFR calc Af Amer 119 >59 mL/min/1.73   BUN/Creatinine Ratio 13 9 - 23   Sodium 140 134 - 144 mmol/L   Potassium 4.2 3.5 - 5.2 mmol/L   Chloride 101 96 - 106 mmol/L   CO2 25 18 - 29 mmol/L   Calcium 9.2 8.7 - 10.2 mg/dL   Total Protein 6.7 6.0 - 8.5 g/dL   Albumin 4.1 3.5 - 5.5 g/dL   Globulin, Total 2.6 1.5 - 4.5 g/dL   Albumin/Globulin Ratio 1.6 1.1 - 2.5   Bilirubin Total 0.3 0.0 - 1.2 mg/dL   Alkaline Phosphatase 51 39 - 117 IU/L   AST 12 0 - 40 IU/L   ALT 13 0 - 32 IU/L  VITAMIN D 25 Hydroxy (Vit-D Deficiency, Fractures)  Result Value Ref Range   Vit D, 25-Hydroxy 28.6 (L) 30.0 - 100.0 ng/mL  TSH  Result Value Ref Range   TSH 3.690 0.450 - 4.500 uIU/mL  CBC with Differential  Result Value Ref Range   WBC 12.5 (H) 3.4 - 10.8 x10E3/uL   RBC 4.45 3.77 - 5.28 x10E6/uL   Hemoglobin 13.2 11.1 - 15.9 g/dL   Hematocrit 84.6 96.2 - 46.6 %   MCV 89 79 - 97 fL   MCH 29.7 26.6 - 33.0 pg   MCHC 33.4 31.5 - 35.7 g/dL   RDW 95.2 84.1 - 32.4 %   Platelets 373 150 - 379 x10E3/uL   Neutrophils 51 %   Lymphs 40 %   Monocytes 5 %   Eos 3 %   Basos 1 %   Neutrophils Absolute 6.3 1.4 - 7.0 x10E3/uL   Lymphocytes Absolute 5.0 (H) 0.7 - 3.1 x10E3/uL    Monocytes Absolute 0.7 0.1 - 0.9 x10E3/uL   EOS (ABSOLUTE) 0.4 0.0 - 0.4 x10E3/uL   Basophils Absolute 0.1 0.0 - 0.2 x10E3/uL   Immature Granulocytes 0 %   Immature Grans (Abs) 0.0 0.0 - 0.1 x10E3/uL  IgE  Result Value Ref Range  IgE (Immunoglobulin E), Serum 33 0 - 100 IU/mL      Assessment & Plan:   Problem List Items Addressed This Visit      Other   Moderate recurrent major depression (HCC)    Continue Prozac daily. Pt appears to be doing well. Rash was irritant related not medication.  Encouraged continued vitamin D. Encouraged 30 minutes of exercise.  Recheck in 3 mos.       Relevant Medications   FLUoxetine (PROZAC) 20 MG tablet    Other Visit Diagnoses    Vaginal irritation    -  Primary    Not prozac related. Resolved with changing pads.        Meds ordered this encounter  Medications  . FLUoxetine (PROZAC) 20 MG tablet    Sig: TAKE 1 TABLET (20 MG TOTAL) BY MOUTH DAILY.    Refill:  11      Follow up plan: Return in about 3 months (around 01/18/2016) for depression.

## 2015-11-08 ENCOUNTER — Ambulatory Visit (INDEPENDENT_AMBULATORY_CARE_PROVIDER_SITE_OTHER): Payer: BLUE CROSS/BLUE SHIELD | Admitting: Family Medicine

## 2015-11-08 VITALS — BP 135/75 | HR 70 | Temp 98.5°F | Resp 16 | Ht 62.0 in | Wt 152.0 lb

## 2015-11-08 DIAGNOSIS — R200 Anesthesia of skin: Secondary | ICD-10-CM

## 2015-11-08 DIAGNOSIS — F331 Major depressive disorder, recurrent, moderate: Secondary | ICD-10-CM | POA: Diagnosis not present

## 2015-11-08 DIAGNOSIS — J4541 Moderate persistent asthma with (acute) exacerbation: Secondary | ICD-10-CM

## 2015-11-08 MED ORDER — FLUTICASONE PROPIONATE 50 MCG/ACT NA SUSP: 2.0000 | Freq: Every day | NASAL | Status: DC

## 2015-11-08 MED ORDER — PREDNISONE 20 MG PO TABS: 40.0000 mg | ORAL_TABLET | Freq: Every day | ORAL | Status: DC

## 2015-11-08 NOTE — Patient Instructions (Addendum)
Asthma: Use your inhaler as needed for chest tightness. Take your flovent inhaler twice daily every day. Take prednisone 40mg  each morning for 5 days.  Please seek immediate medical attention if you develop shortness of breath not relieve by inhaler, chest pain/tightness, fever > 103 F or other concerning symptoms.   Hand numbness: Sounds like carpal tunnel but if it improved with stopping prozac, you can taper off. Take the pill every other day for about 1 week and then stop.  Try a wrist splint at bedtime to help with symptoms. I would do this after you finish the prednisone.

## 2015-11-08 NOTE — Assessment & Plan Note (Signed)
Treat for exacerbation today. Continue ICS and albuterol PRN. Start prednisone 40mg  once daily for 5 days. Alarm symptoms reviewed. Return if not improving.

## 2015-11-08 NOTE — Assessment & Plan Note (Signed)
Taper off of prozac after completing prednisone for side effects.

## 2015-11-08 NOTE — Progress Notes (Signed)
Subjective:    Patient ID: Karen Martinez, female    DOB: 1966-08-10, 50 y.o.   MRN: [id number]  HPI: [place] DesanctisDonna S Martinez is a 50 y.o. female presenting on 11/08/2015 for Asthma and Hand Pain   HPI   Pt reports chest tightness with URI.  She doesn't feel like she is breathing well. Is having cough. No nasal congestion. Some laryngitis.  Not sure if allergies or URI. Using inhaler more frequently. Using proair 2-3 times away. Increased from her baseline. Night time awakenings. Had stopped steroid inhalers- had restarted today. Has helped. Taking claritin daily.   Pt presents about numbness in her 1st three fingers of L hand. Painful and numb. Started in thumb and moved 2 first 3 fingers. Symptoms worse at night. Holds under the pillow and symptoms go away. Noticed that symptoms got better when she skipped her prozac.   Past Medical History  Diagnosis Date  . Allergy   . Asthma   . Depression   . Emphysema of lung (HCC)   . Heart murmur     heart arrhythmias  . Hyperlipidemia   . Thyroid disease   . Urine incontinence     Current Outpatient Prescriptions on File Prior to Visit  Medication Sig  . albuterol (PROVENTIL HFA;VENTOLIN HFA) 108 (90 BASE) MCG/ACT inhaler Inhale 2 puffs into the lungs every 6 (six) hours as needed for wheezing or shortness of breath.  . calcium-vitamin D (OSCAL WITH D) 250-125 MG-UNIT tablet Take 1 tablet by mouth daily.  Marland Kitchen. FLUoxetine (PROZAC) 20 MG tablet TAKE 1 TABLET (20 MG TOTAL) BY MOUTH DAILY.  . fluticasone (FLOVENT HFA) 220 MCG/ACT inhaler Inhale 2 puffs into the lungs 2 (two) times daily.  Marland Kitchen. loratadine (CLARITIN) 10 MG tablet Take 10 mg by mouth daily.  . naproxen (NAPROSYN) 500 MG tablet Take 500 mg by mouth 2 (two) times daily with a meal.  . NUVARING 0.12-0.015 MG/24HR vaginal ring INSERT A NUVARING INTO VAGINA, LEAVE IN PLACE 3 (THREE) WEEKS AND REMOVE FOR 1 WEEK.  . Omega-3 Fatty Acids (FISH OIL) 1000 MG CAPS Take 1 capsule (1,000 mg total) by mouth  daily.  Marland Kitchen. triamcinolone (NASACORT AQ) 55 MCG/ACT AERO nasal inhaler Place 2 sprays into the nose daily.  . vitamin C (ASCORBIC ACID) 500 MG tablet Take 500 mg by mouth daily.   No current facility-administered medications on file prior to visit.    Review of Systems  Constitutional: Negative for fever and chills.  HENT: Negative.   Respiratory: Positive for cough, chest tightness and wheezing.   Cardiovascular: Negative for chest pain and leg swelling.  Gastrointestinal: Negative for nausea, vomiting, abdominal pain, diarrhea and constipation.  Endocrine: Negative.  Negative for cold intolerance, heat intolerance, polydipsia, polyphagia and polyuria.  Genitourinary: Negative for dysuria and difficulty urinating.  Musculoskeletal: Positive for arthralgias (L hand 1st fingers).  Neurological: Positive for numbness (L hand 1st 3 fingers.). Negative for dizziness and light-headedness.  Psychiatric/Behavioral: Negative.    Per HPI unless specifically indicated above     Objective:    BP 135/75 mmHg  Pulse 70  Temp(Src) 98.5 F (36.9 C) (Oral)  Resp 16  Ht 5\' 2"  (1.575 m)  Wt 152 lb (68.947 kg)  BMI 27.79 kg/m2  SpO2 100%  LMP 10/16/2015  Wt Readings from Last 3 Encounters:  11/08/15 152 lb (68.947 kg)  10/21/15 155 lb (70.308 kg)  09/17/15 155 lb 9.6 oz (70.58 kg)    Physical Exam  Constitutional: She is  oriented to person, place, and time. She appears well-developed and well-nourished.  HENT:  Head: Normocephalic and atraumatic.  Right Ear: Hearing and tympanic membrane normal.  Left Ear: Hearing and tympanic membrane normal.  Nose: Mucosal edema present. No rhinorrhea. Right sinus exhibits no maxillary sinus tenderness and no frontal sinus tenderness. Left sinus exhibits no maxillary sinus tenderness and no frontal sinus tenderness.  Mouth/Throat: Posterior oropharyngeal erythema present. No oropharyngeal exudate or posterior oropharyngeal edema.  Neck: Neck supple.    Cardiovascular: Normal rate, regular rhythm and normal heart sounds.  Exam reveals no gallop and no friction rub.   No murmur heard. Pulmonary/Chest: Effort normal. No respiratory distress. She has wheezes in the right lower field and the left lower field. She has no rhonchi. She has no rales. Chest wall is not dull to percussion. She exhibits no tenderness.  Abdominal: Soft. Normal appearance and bowel sounds are normal. She exhibits no distension and no mass. There is no tenderness. There is no rebound and no guarding.  Musculoskeletal: Normal range of motion. She exhibits no edema or tenderness.  Lymphadenopathy:    She has no cervical adenopathy.  Neurological: She is alert and oriented to person, place, and time. She has normal strength. No cranial nerve deficit or sensory deficit.  Reflex Scores:      Brachioradialis reflexes are 2+ on the right side and 2+ on the left side. + Tinel test and negative Phalen's on L hand.  Skin: Skin is warm and dry.   Results for orders placed or performed in visit on 08/08/15  HM MAMMOGRAPHY  Result Value Ref Range   HM Mammogram normal   HM PAP SMEAR  Result Value Ref Range   HM Pap smear normal   Lipid Profile  Result Value Ref Range   Cholesterol, Total 213 (H) 100 - 199 mg/dL   Triglycerides 469 (H) 0 - 149 mg/dL   HDL 47 >62 mg/dL   VLDL Cholesterol Cal 51 (H) 5 - 40 mg/dL   LDL Calculated 952 (H) 0 - 99 mg/dL   Chol/HDL Ratio 4.5 (H) 0.0 - 4.4 ratio units  Comprehensive Metabolic Panel (CMET)  Result Value Ref Range   Glucose 80 65 - 99 mg/dL   BUN 9 6 - 24 mg/dL   Creatinine, Ser 8.41 0.57 - 1.00 mg/dL   GFR calc non Af Amer 104 >59 mL/min/1.73   GFR calc Af Amer 119 >59 mL/min/1.73   BUN/Creatinine Ratio 13 9 - 23   Sodium 140 134 - 144 mmol/L   Potassium 4.2 3.5 - 5.2 mmol/L   Chloride 101 96 - 106 mmol/L   CO2 25 18 - 29 mmol/L   Calcium 9.2 8.7 - 10.2 mg/dL   Total Protein 6.7 6.0 - 8.5 g/dL   Albumin 4.1 3.5 - 5.5 g/dL    Globulin, Total 2.6 1.5 - 4.5 g/dL   Albumin/Globulin Ratio 1.6 1.1 - 2.5   Bilirubin Total 0.3 0.0 - 1.2 mg/dL   Alkaline Phosphatase 51 39 - 117 IU/L   AST 12 0 - 40 IU/L   ALT 13 0 - 32 IU/L  VITAMIN D 25 Hydroxy (Vit-D Deficiency, Fractures)  Result Value Ref Range   Vit D, 25-Hydroxy 28.6 (L) 30.0 - 100.0 ng/mL  TSH  Result Value Ref Range   TSH 3.690 0.450 - 4.500 uIU/mL  CBC with Differential  Result Value Ref Range   WBC 12.5 (H) 3.4 - 10.8 x10E3/uL   RBC 4.45 3.77 - 5.28 x10E6/uL  Hemoglobin 13.2 11.1 - 15.9 g/dL   Hematocrit 96.2 95.2 - 46.6 %   MCV 89 79 - 97 fL   MCH 29.7 26.6 - 33.0 pg   MCHC 33.4 31.5 - 35.7 g/dL   RDW 84.1 32.4 - 40.1 %   Platelets 373 150 - 379 x10E3/uL   Neutrophils 51 %   Lymphs 40 %   Monocytes 5 %   Eos 3 %   Basos 1 %   Neutrophils Absolute 6.3 1.4 - 7.0 x10E3/uL   Lymphocytes Absolute 5.0 (H) 0.7 - 3.1 x10E3/uL   Monocytes Absolute 0.7 0.1 - 0.9 x10E3/uL   EOS (ABSOLUTE) 0.4 0.0 - 0.4 x10E3/uL   Basophils Absolute 0.1 0.0 - 0.2 x10E3/uL   Immature Granulocytes 0 %   Immature Grans (Abs) 0.0 0.0 - 0.1 x10E3/uL  IgE  Result Value Ref Range   IgE (Immunoglobulin E), Serum 33 0 - 100 IU/mL      Assessment & Plan:   Problem List Items Addressed This Visit      Respiratory   Moderate persistent asthma - Primary    Treat for exacerbation today. Continue ICS and albuterol PRN. Start prednisone  once daily for 5 days. Alarm symptoms reviewed. Return if not improving.       Relevant Medications   predniSONE (DELTASONE) 20 MG tablet   fluticasone (FLONASE) 50 MCG/ACT nasal spray     Other   Moderate recurrent major depression (HCC)    Taper off of prozac after completing prednisone for side effects.        Other Visit Diagnoses    Numbness of finger        Likely 2/2 carpal tunnel however symptoms improved with no prozac. Pt will taper off over a few days to see if symptoms improve. Brace at night. Recheck 1 mos.          Meds ordered this encounter  Medications  . DISCONTD: FLUoxetine (PROZAC) 20 MG capsule    Sig: Take 20 mg by mouth daily.    Refill:  10  . predniSONE (DELTASONE) 20 MG tablet    Sig: Take 2 tablets (40 mg total) by mouth daily with breakfast.    Dispense:  10 tablet    Refill:  0    Order Specific Question:  Supervising Provider    Answer:  Janeann Forehand [phone]  . fluticasone (FLONASE) 50 MCG/ACT nasal spray    Sig: Place 2 sprays into both nostrils daily.    Dispense:  16 g    Refill:  11    Order Specific Question:  Supervising Provider    Answer:  Janeann Forehand [phone]      Follow up plan: Return in about 4 weeks (around 12/06/2015) for hand pain.

## 2015-11-25 LAB — CBC WITH DIFFERENTIAL/PLATELET
BASOS: 1 %
Basophils Absolute: 0.1 10*3/uL (ref 0.0–0.2)
EOS (ABSOLUTE): 0.4 10*3/uL (ref 0.0–0.4)
EOS: 5 %
Hematocrit: 37.3 % (ref 34.0–46.6)
Hemoglobin: 12.7 g/dL (ref 11.1–15.9)
IMMATURE GRANS (ABS): 0 10*3/uL (ref 0.0–0.1)
IMMATURE GRANULOCYTES: 0 %
LYMPHS: 25 %
Lymphocytes Absolute: 2.2 10*3/uL (ref 0.7–3.1)
MCH: 29.8 pg (ref 26.6–33.0)
MCHC: 34 g/dL (ref 31.5–35.7)
MCV: 88 fL (ref 79–97)
MONOCYTES: 7 %
Monocytes Absolute: 0.6 10*3/uL (ref 0.1–0.9)
NEUTROS ABS: 5.4 10*3/uL (ref 1.4–7.0)
NEUTROS PCT: 62 %
PLATELETS: 334 10*3/uL (ref 150–379)
RBC: 4.26 x10E6/uL (ref 3.77–5.28)
RDW: 13.8 % (ref 12.3–15.4)
WBC: 8.8 10*3/uL (ref 3.4–10.8)

## 2016-01-15 ENCOUNTER — Ambulatory Visit (INDEPENDENT_AMBULATORY_CARE_PROVIDER_SITE_OTHER): Payer: BLUE CROSS/BLUE SHIELD | Admitting: Family Medicine

## 2016-01-15 ENCOUNTER — Encounter: Payer: Self-pay | Admitting: Family Medicine

## 2016-01-15 VITALS — BP 124/81 | HR 74 | Temp 99.1°F | Resp 16 | Ht 62.0 in | Wt 156.0 lb

## 2016-01-15 DIAGNOSIS — Z3040 Encounter for surveillance of contraceptives, unspecified: Secondary | ICD-10-CM

## 2016-01-15 DIAGNOSIS — J4541 Moderate persistent asthma with (acute) exacerbation: Secondary | ICD-10-CM | POA: Diagnosis not present

## 2016-01-15 DIAGNOSIS — Z01419 Encounter for gynecological examination (general) (routine) without abnormal findings: Secondary | ICD-10-CM | POA: Diagnosis not present

## 2016-01-15 DIAGNOSIS — Z3049 Encounter for surveillance of other contraceptives: Secondary | ICD-10-CM | POA: Diagnosis not present

## 2016-01-15 MED ORDER — PREDNISONE 20 MG PO TABS: 40.0000 mg | ORAL_TABLET | Freq: Every day | ORAL | Status: DC

## 2016-01-15 MED ORDER — NUVARING 0.12-0.015 MG/24HR VA RING: 1.0000 | VAGINAL_RING | VAGINAL | Status: DC

## 2016-01-15 MED ORDER — BUDESONIDE-FORMOTEROL FUMARATE 80-4.5 MCG/ACT IN AERO: 2.0000 | INHALATION_SPRAY | Freq: Two times a day (BID) | RESPIRATORY_TRACT | Status: DC

## 2016-01-15 MED ORDER — MONTELUKAST SODIUM 10 MG PO TABS: 10.0000 mg | ORAL_TABLET | Freq: Every day | ORAL | Status: DC

## 2016-01-15 NOTE — Patient Instructions (Signed)
Let's try switching to singulair and Symbicort for your Asthma. Take 2 puffs twice daily for symbicort.  https://moses.info/Https://www.mysymbicort.com/asthma.html  Go to website to sign up for savings card. Take prednisone for 5 days.   Please seek immediate medical attention if you develop shortness of breath not relieve by inhaler, chest pain/tightness, fever > 103 F or other concerning symptoms.

## 2016-01-15 NOTE — Assessment & Plan Note (Signed)
Treat again for exacerbation. Educated pt on need for controller medication. Try symbicort with coupon. Add singulair for allergic component.  Alarm symptoms reviewed. Return if not improving. If frequent exacerbations continue- consider pulm referral.

## 2016-01-15 NOTE — Progress Notes (Signed)
Subjective:    Patient ID: Karen Martinez, female    DOB: 11-12-1965, 50 y.o.   MRN: [id number]  HPI: Karen Martinez is a 50 y.o. female presenting on 01/15/2016 for Annual Exam   HPI  Pt presents for well woman exam today. Pap done last year- normal. HPV negative. Mammogram done last year-would like screen every 2 years.Marland Kitchen LMP: 12/22/2015. Using Nuvaring- using ring continuous for 7 weeks. Works well. No history of blood clots. No migraine with aura. Non-smoker.   Pt is also having issues with her asthma. Having a cough. Chest tightness. Using her albuterol 3-4 times per day. Helps with chest tightness. Is not able to use flovent daily due to cough. Uses flovent daily since symptoms started and has felt better.   Past Medical History  Diagnosis Date  . Allergy   . Asthma   . Depression   . Emphysema of lung (HCC)   . Heart murmur     heart arrhythmias  . Hyperlipidemia   . Thyroid disease   . Urine incontinence    Social History   Social History  . Marital Status: Married    Spouse Name: N/A  . Number of Children: N/A  . Years of Education: N/A   Occupational History  . Not on file.   Social History Main Topics  . Smoking status: Never Smoker   . Smokeless tobacco: Not on file  . Alcohol Use: Yes  . Drug Use: No  . Sexual Activity: Not on file   Other Topics Concern  . Not on file   Social History Narrative   Family History  Problem Relation Age of Onset  . Stroke Father   . Heart disease Father   . Cancer Father     facial   . Hyperlipidemia Father   . Cancer Maternal Grandfather   . Heart disease Maternal Grandfather   . Alcohol abuse Maternal Grandfather   . Alcohol abuse Paternal Grandfather    Current Outpatient Prescriptions on File Prior to Visit  Medication Sig  . albuterol (PROVENTIL HFA;VENTOLIN HFA) 108 (90 BASE) MCG/ACT inhaler Inhale 2 puffs into the lungs every 6 (six) hours as needed for wheezing or shortness of breath.  . calcium-vitamin D  (OSCAL WITH D) 250-125 MG-UNIT tablet Take 1 tablet by mouth daily.  . fluticasone (FLONASE) 50 MCG/ACT nasal spray Place 2 sprays into both nostrils daily.  Marland Kitchen loratadine (CLARITIN) 10 MG tablet Take 10 mg by mouth daily.  . naproxen (NAPROSYN) 500 MG tablet Take 500 mg by mouth 2 (two) times daily with a meal.  . Omega-3 Fatty Acids (FISH OIL) 1000 MG CAPS Take 1 capsule (1,000 mg total) by mouth daily.  Marland Kitchen triamcinolone (NASACORT AQ) 55 MCG/ACT AERO nasal inhaler Place 2 sprays into the nose daily.  . vitamin C (ASCORBIC ACID) 500 MG tablet Take 500 mg by mouth daily.   No current facility-administered medications on file prior to visit.    Review of Systems  Constitutional: Negative for fever and chills.  HENT: Negative.   Respiratory: Positive for cough, chest tightness and wheezing.   Cardiovascular: Negative for chest pain and leg swelling.  Gastrointestinal: Negative for nausea, vomiting, abdominal pain, diarrhea and constipation.  Endocrine: Negative.  Negative for cold intolerance, heat intolerance, polydipsia, polyphagia and polyuria.  Genitourinary: Negative for dysuria, vaginal bleeding, vaginal discharge, difficulty urinating, vaginal pain and pelvic pain.  Musculoskeletal: Negative.   Neurological: Negative for dizziness, light-headedness and numbness.  Psychiatric/Behavioral: Negative.  Per HPI unless specifically indicated above     Objective:    BP 124/81 mmHg  Pulse 74  Temp(Src) 99.1 F (37.3 C) (Oral)  Resp 16  Ht 5\' 2"  (1.575 m)  Wt 156 lb (70.761 kg)  BMI 28.53 kg/m2  LMP 12/22/2015 (Approximate)  Wt Readings from Last 3 Encounters:  01/15/16 156 lb (70.761 kg)  11/08/15 152 lb (68.947 kg)  10/21/15 155 lb (70.308 kg)    Physical Exam  Constitutional: She is oriented to person, place, and time. She appears well-developed and well-nourished.  HENT:  Head: Normocephalic and atraumatic.  Neck: Normal range of motion. Neck supple. No thyromegaly  present.  Cardiovascular: Normal rate and regular rhythm.  Exam reveals no gallop and no friction rub.   No murmur heard. Pulmonary/Chest: Breath sounds normal. No respiratory distress. She has no wheezes. Right breast exhibits no inverted nipple, no mass, no nipple discharge and no tenderness. Left breast exhibits no inverted nipple, no mass, no nipple discharge and no tenderness.  Abdominal: Soft. Bowel sounds are normal. She exhibits no distension. There is no tenderness.  Genitourinary: Vagina normal and uterus normal. No breast swelling, tenderness or discharge. There is no rash or tenderness on the right labia. There is no rash, tenderness or lesion on the left labia. Uterus is not tender. Cervix exhibits no motion tenderness, no discharge and no friability. Right adnexum displays no mass, no tenderness and no fullness. Left adnexum displays no mass, no tenderness and no fullness. No erythema, tenderness or bleeding in the vagina.  Nuva Ring present inside vagina.   Musculoskeletal: Normal range of motion. She exhibits no edema or tenderness.  Lymphadenopathy:    She has no cervical adenopathy.  Neurological: She is alert and oriented to person, place, and time.  Skin: Skin is warm and dry.  Psychiatric: She has a normal mood and affect. Her behavior is normal. Judgment normal.   Results for orders placed or performed in visit on 01/15/16  HM PAP SMEAR  Result Value Ref Range   HM Pap smear negative       Assessment & Plan:   Problem List Items Addressed This Visit      Respiratory   Moderate persistent asthma    Treat again for exacerbation. Educated pt on need for controller medication. Try symbicort with coupon. Add singulair for allergic component.  Alarm symptoms reviewed. Return if not improving. If frequent exacerbations continue- consider pulm referral.       Relevant Medications   budesonide-formoterol (SYMBICORT) 80-4.5 MCG/ACT inhaler   montelukast (SINGULAIR) 10 MG  tablet   predniSONE (DELTASONE) 20 MG tablet    Other Visit Diagnoses    Well woman exam with routine gynecological exam    -  Primary    No pap done. Mammo- will screen next year. Reviewed risk vs benefits of nuva-ring with age. Renewed for this year.     Encounter for surveillance of contraceptive device        Reviewed risk vs benefits of CHC with age > 100. Will continue nuva ring.        Meds ordered this encounter  Medications  . DISCONTD: fluticasone (FLOVENT HFA) 220 MCG/ACT inhaler    Sig: Inhale 2 puffs into the lungs 2 (two) times daily.  Marland Kitchen NUVARING 0.12-0.015 MG/24HR vaginal ring    Sig: Place 1 each vaginally every 28 (twenty-eight) days. Insert vaginally and leave in place for 3 consecutive weeks, then remove for 1 week.  Dispense:  1 each    Refill:  11    Order Specific Question:  Supervising Provider    Answer:  Janeann ForehandHAWKINS JR, JAMES H [phone][[id number]]  . budesonide-formoterol (SYMBICORT) 80-4.5 MCG/ACT inhaler    Sig: Inhale 2 puffs into the lungs 2 (two) times daily.    Dispense:  1 Inhaler    Refill:  11    Order Specific Question:  Supervising Provider    Answer:  Janeann ForehandHAWKINS JR, JAMES H [phone][[id number]]  . montelukast (SINGULAIR) 10 MG tablet    Sig: Take 1 tablet (10 mg total) by mouth at bedtime.    Dispense:  30 tablet    Refill:  3    Order Specific Question:  Supervising Provider    Answer:  Janeann ForehandHAWKINS JR, JAMES H [phone][[id number]]  . predniSONE (DELTASONE) 20 MG tablet    Sig: Take 2 tablets (40 mg total) by mouth daily with breakfast. For 5 days.    Dispense:  10 tablet    Refill:  1    Order Specific Question:  Supervising Provider    Answer:  Janeann ForehandHAWKINS JR, JAMES H [[id number]]      Follow up plan: Return in about 1 year (around 01/14/2017) for Well woman.

## 2016-01-21 ENCOUNTER — Encounter: Payer: BLUE CROSS/BLUE SHIELD | Admitting: Family Medicine

## 2016-01-28 ENCOUNTER — Ambulatory Visit: Payer: BLUE CROSS/BLUE SHIELD | Admitting: Family Medicine

## 2016-05-18 ENCOUNTER — Ambulatory Visit (INDEPENDENT_AMBULATORY_CARE_PROVIDER_SITE_OTHER): Payer: BLUE CROSS/BLUE SHIELD | Admitting: Family Medicine

## 2016-05-18 ENCOUNTER — Encounter: Payer: Self-pay | Admitting: Family Medicine

## 2016-05-18 VITALS — BP 128/65 | HR 88 | Temp 99.0°F | Resp 18 | Ht 62.0 in | Wt 162.2 lb

## 2016-05-18 DIAGNOSIS — J4541 Moderate persistent asthma with (acute) exacerbation: Secondary | ICD-10-CM | POA: Diagnosis not present

## 2016-05-18 DIAGNOSIS — J020 Streptococcal pharyngitis: Secondary | ICD-10-CM | POA: Diagnosis not present

## 2016-05-18 LAB — POCT RAPID STREP A (OFFICE): RAPID STREP A SCREEN: POSITIVE — AB

## 2016-05-18 MED ORDER — AZITHROMYCIN 250 MG PO TABS: ORAL_TABLET | ORAL | 0 refills | Status: DC

## 2016-05-18 MED ORDER — PREDNISONE 20 MG PO TABS: 40.0000 mg | ORAL_TABLET | Freq: Every day | ORAL | 1 refills | Status: DC

## 2016-05-18 NOTE — Progress Notes (Signed)
Subjective:    Patient ID: [name], female    DOB: 1966-01-08, 50 y.o.   MRN: [id number]  HPI: [name] is a 50 y.o. female presenting on 05/18/2016 for Fever   HPI  Pt presents for fever, chills, nasal drainage, and lymphadenopathy. Symptoms began on Friday with malasie. Saturday worsened- started having chills, sore throat, nasal drainage. Ear started to hurt this morning. Having trouble laying flat- lots of drainage. Tmax 101. Sore throat is the worst. Hurts to swallow. Feels swollen and tight. No trouble breathing sitting up. No cough. No chest tightness. Asthma is doing well. No atypical use of inhaler. Worst symptom is sore throat.  Home treatment: Vicks vapor rub. Nyquil. No relief.    Past Medical History:  Diagnosis Date  . Allergy   . Asthma   . Depression   . Emphysema of lung (HCC)   . Heart murmur    heart arrhythmias  . Hyperlipidemia   . Thyroid disease   . Urine incontinence     Current Outpatient Prescriptions on File Prior to Visit  Medication Sig  . albuterol (PROVENTIL HFA;VENTOLIN HFA) 108 (90 BASE) MCG/ACT inhaler Inhale 2 puffs into the lungs every 6 (six) hours as needed for wheezing or shortness of breath.  . calcium-vitamin D (OSCAL WITH D) 250-125 MG-UNIT tablet Take 1 tablet by mouth daily.  . fluticasone (FLONASE) 50 MCG/ACT nasal spray Place 2 sprays into both nostrils daily.  Marland Kitchen. loratadine (CLARITIN) 10 MG tablet Take 10 mg by mouth daily.  Marland Kitchen. NUVARING 0.12-0.015 MG/24HR vaginal ring Place 1 each vaginally every 28 (twenty-eight) days. Insert vaginally and leave in place for 3 consecutive weeks, then remove for 1 week.  . vitamin C (ASCORBIC ACID) 500 MG tablet Take 500 mg by mouth daily.  . budesonide-formoterol (SYMBICORT) 80-4.5 MCG/ACT inhaler Inhale 2 puffs into the lungs 2 (two) times daily. (Patient not taking: Reported on 05/18/2016)  . montelukast (SINGULAIR) 10 MG tablet Take 1 tablet (10 mg total) by mouth at bedtime. (Patient not  taking: Reported on 05/18/2016)  . naproxen (NAPROSYN) 500 MG tablet Take 500 mg by mouth 2 (two) times daily with a meal.  . Omega-3 Fatty Acids (FISH OIL) 1000 MG CAPS Take 1 capsule (1,000 mg total) by mouth daily. (Patient not taking: Reported on 05/18/2016)  . triamcinolone (NASACORT AQ) 55 MCG/ACT AERO nasal inhaler Place 2 sprays into the nose daily.   No current facility-administered medications on file prior to visit.     Review of Systems Per HPI unless specifically indicated above     Objective:    BP 128/65   Pulse 88   Temp 99 F (37.2 C) (Oral)   Resp 18   Ht 5\' 2"  (1.575 m)   Wt 162 lb 3.2 oz (73.6 kg)   SpO2 100%   BMI 29.67 kg/m   Wt Readings from Last 3 Encounters:  05/18/16 162 lb 3.2 oz (73.6 kg)  01/15/16 156 lb (70.8 kg)  11/08/15 152 lb (68.9 kg)    Physical Exam  Constitutional: She appears well-developed and well-nourished. No distress.  HENT:  Right Ear: Hearing and tympanic membrane normal.  Left Ear: Hearing and tympanic membrane normal.  Nose: No mucosal edema or rhinorrhea.  Mouth/Throat: Uvula is midline. Mucous membranes are not pale and not dry. Oropharyngeal exudate and posterior oropharyngeal erythema present. No tonsillar abscesses.  Beefy red throat. Tonsils at +2  Neck: Normal range of motion. Neck supple. No neck rigidity. No  edema, no erythema and normal range of motion present. No Brudzinski'Martinez sign and no Kernig'Martinez sign noted.  Pulmonary/Chest: Effort normal. She has no decreased breath sounds. She has no wheezes. She has no rhonchi. She has no rales.  Lymphadenopathy:       Head (right side): Tonsillar adenopathy present. No submental and no submandibular adenopathy present.       Head (left side): Tonsillar adenopathy present. No submental and no submandibular adenopathy present.    She has cervical adenopathy.       Right cervical: Superficial cervical adenopathy present.       Left cervical: Superficial cervical adenopathy present.   Skin: She is not diaphoretic.   Results for orders placed or performed in visit on 05/18/16  POCT rapid strep A  Result Value Ref Range   Rapid Strep A Screen Positive (A) Negative      Assessment & Plan:   Problem List Items Addressed This Visit      Respiratory   Moderate persistent asthma   Relevant Medications   predniSONE (DELTASONE) 20 MG tablet    Other Visit Diagnoses    Strep pharyngitis    -  Primary   Treat for strep 2/2 +strep test. Due to hives from Keflex will avoid any PCN'Martinez today. Treat with Azithromycin as second line. Supportive care. Prednisone due to throat swelling. Alarm symptoms reviewed. Return if not improving.    Relevant Medications   azithromycin (ZITHROMAX) 250 MG tablet   Other Relevant Orders   POCT rapid strep A (Completed)      Meds ordered this encounter  Medications  . azithromycin (ZITHROMAX) 250 MG tablet    Sig: Take 2 pills today and 1 pill daily until the bottle is empty.    Dispense:  6 tablet    Refill:  0    Order Specific Question:   Supervising Provider    Answer:   Janeann Forehand [[id number]]  . predniSONE (DELTASONE) 20 MG tablet    Sig: Take 2 tablets (40 mg total) by mouth daily with breakfast. For 5 days.    Dispense:  10 tablet    Refill:  1    Order Specific Question:   Supervising Provider    Answer:   Janeann Forehand [phone]      Follow up plan: Return if symptoms worsen or fail to improve.

## 2016-05-18 NOTE — Patient Instructions (Addendum)

## 2016-05-25 ENCOUNTER — Other Ambulatory Visit: Payer: Self-pay | Admitting: Family Medicine

## 2016-05-25 ENCOUNTER — Ambulatory Visit (INDEPENDENT_AMBULATORY_CARE_PROVIDER_SITE_OTHER): Payer: BLUE CROSS/BLUE SHIELD | Admitting: Family Medicine

## 2016-05-25 ENCOUNTER — Encounter: Payer: Self-pay | Admitting: Family Medicine

## 2016-05-25 VITALS — BP 120/66 | HR 68 | Temp 98.4°F | Resp 16 | Ht 62.0 in | Wt 164.0 lb

## 2016-05-25 DIAGNOSIS — R109 Unspecified abdominal pain: Secondary | ICD-10-CM

## 2016-05-25 DIAGNOSIS — R5382 Chronic fatigue, unspecified: Secondary | ICD-10-CM | POA: Diagnosis not present

## 2016-05-25 DIAGNOSIS — E559 Vitamin D deficiency, unspecified: Secondary | ICD-10-CM | POA: Diagnosis not present

## 2016-05-25 DIAGNOSIS — J029 Acute pharyngitis, unspecified: Secondary | ICD-10-CM | POA: Diagnosis not present

## 2016-05-25 LAB — COMPLETE METABOLIC PANEL WITH GFR
ALBUMIN: 3.7 g/dL (ref 3.6–5.1)
ALT: 20 U/L (ref 6–29)
AST: 13 U/L (ref 10–35)
Alkaline Phosphatase: 50 U/L (ref 33–115)
BILIRUBIN TOTAL: 0.3 mg/dL (ref 0.2–1.2)
BUN: 12 mg/dL (ref 7–25)
CALCIUM: 9.4 mg/dL (ref 8.6–10.2)
CHLORIDE: 103 mmol/L (ref 98–110)
CO2: 28 mmol/L (ref 20–31)
CREATININE: 0.77 mg/dL (ref 0.50–1.10)
GFR, Est Non African American: >89 mL/min (ref >=60)
Glucose, Bld: 81 mg/dL (ref 65–99)
Potassium: 4.3 mmol/L (ref 3.5–5.3)
Sodium: 137 mmol/L (ref 135–146)
TOTAL PROTEIN: 6.7 g/dL (ref 6.1–8.1)

## 2016-05-25 LAB — POCT URINALYSIS DIPSTICK
BILIRUBIN UA: NEGATIVE
Blood, UA: NEGATIVE
GLUCOSE UA: NEGATIVE
Ketones, UA: NEGATIVE
LEUKOCYTES UA: NEGATIVE
NITRITE UA: NEGATIVE
Protein, UA: NEGATIVE
Spec Grav, UA: 1.01
UROBILINOGEN UA: NEGATIVE
pH, UA: 5

## 2016-05-25 NOTE — Progress Notes (Signed)
Subjective:    Patient ID: Karen Martinez, female    DOB: [birth year], 50 y.o.   MRN: [id number]  HPI: Karen Martinez is a 50 y.o. female presenting on 05/25/2016 for Back Pain (pt had sore throat last week got improved with antibiotics and she is finished last dose and felt it's back onset last night had kidney pain not feels like UTI )   HPI   Pt presents for follow-up of strep throat. Was positive last Monday. Took Zpak due to hives with cephlasporins. Sore throat returned after she stopped abx on Saturday. Doesn't initially hurt to swallow but does hurt after 5 sips. Pain feels deep in the throat.  No fevers. Some chills. No trouble breathing. No nasal drainage or ear congestion. Does have sinus congestion that is normal for her.  Feeling drained. Weak. Tired all last week.  Kidney/ Low back pain- started last Friday night (9/15) all evening- got better the next morning. . Kidney pain returned last night (Sunday).  Both sides mid thoracic pain. Pain radiates all through the middle. No dysuria. No hesitancy. No foul smell to urine. Looks bright orange in the morning. Was fine this morning.   Past Medical History:  Diagnosis Date  . Allergy   . Asthma   . Depression   . Emphysema of lung (HCC)   . Heart murmur    heart arrhythmias  . Hyperlipidemia   . Thyroid disease   . Urine incontinence     Current Outpatient Prescriptions on File Prior to Visit  Medication Sig  . albuterol (PROVENTIL HFA;VENTOLIN HFA) 108 (90 BASE) MCG/ACT inhaler Inhale 2 puffs into the lungs every 6 (six) hours as needed for wheezing or shortness of breath.  Marland Kitchen azithromycin (ZITHROMAX) 250 MG tablet Take 2 pills today and 1 pill daily until the bottle is empty.  . budesonide-formoterol (SYMBICORT) 80-4.5 MCG/ACT inhaler Inhale 2 puffs into the lungs 2 (two) times daily.  . calcium-vitamin D (OSCAL WITH D) 250-125 MG-UNIT tablet Take 1 tablet by mouth daily.  . fluticasone (FLONASE) 50 MCG/ACT nasal spray Place 2  sprays into both nostrils daily.  Marland Kitchen loratadine (CLARITIN) 10 MG tablet Take 10 mg by mouth daily.  . montelukast (SINGULAIR) 10 MG tablet Take 1 tablet (10 mg total) by mouth at bedtime.  . naproxen (NAPROSYN) 500 MG tablet Take 500 mg by mouth 2 (two) times daily with a meal.  . NUVARING 0.12-0.015 MG/24HR vaginal ring Place 1 each vaginally every 28 (twenty-eight) days. Insert vaginally and leave in place for 3 consecutive weeks, then remove for 1 week.  . Omega-3 Fatty Acids (FISH OIL) 1000 MG CAPS Take 1 capsule (1,000 mg total) by mouth daily.  . predniSONE (DELTASONE) 20 MG tablet Take 2 tablets (40 mg total) by mouth daily with breakfast. For 5 days.  Marland Kitchen triamcinolone (NASACORT AQ) 55 MCG/ACT AERO nasal inhaler Place 2 sprays into the nose daily.  . vitamin C (ASCORBIC ACID) 500 MG tablet Take 500 mg by mouth daily.   No current facility-administered medications on file prior to visit.     Review of Systems  Constitutional: Positive for fatigue.  HENT: Positive for postnasal drip and sore throat. Negative for congestion, ear pain, mouth sores, nosebleeds, trouble swallowing and voice change.   Respiratory: Negative for chest tightness, shortness of breath and wheezing.   Cardiovascular: Negative for chest pain.  Gastrointestinal: Negative for abdominal pain, nausea and vomiting.  Genitourinary: Positive for flank pain.  Musculoskeletal: Negative for  neck pain and neck stiffness.   Per HPI unless specifically indicated above     Objective:    BP 120/66   Pulse 68   Temp 98.4 F (36.9 C) (Oral)   Resp 16   Ht 5\' 2"  (1.575 m)   Wt 164 lb (74.4 kg)   BMI 30.00 kg/m   Wt Readings from Last 3 Encounters:  05/25/16 164 lb (74.4 kg)  05/18/16 162 lb 3.2 oz (73.6 kg)  01/15/16 156 lb (70.8 kg)    Physical Exam  Constitutional: Vital signs are normal. She appears well-developed and well-nourished. She is cooperative.  HENT:  Right Ear: Hearing and tympanic membrane normal.    Left Ear: Hearing and tympanic membrane normal.  Nose: Mucosal edema present. Right sinus exhibits no maxillary sinus tenderness and no frontal sinus tenderness. Left sinus exhibits no maxillary sinus tenderness and no frontal sinus tenderness.  Mouth/Throat: Uvula is midline and mucous membranes are normal. No oropharyngeal exudate, posterior oropharyngeal edema, posterior oropharyngeal erythema or tonsillar abscesses.  Abdominal: Soft. Normal appearance. There is no tenderness. There is no CVA tenderness.  Lymphadenopathy:       Head (right side): No submental, no submandibular, no tonsillar, no preauricular and no posterior auricular adenopathy present.       Head (left side): No submental, no submandibular, no tonsillar, no preauricular and no posterior auricular adenopathy present.    She has cervical adenopathy.       Right cervical: Superficial cervical adenopathy present.       Left cervical: Superficial cervical adenopathy present.  Neurological: She is alert.   Results for orders placed or performed in visit on 05/25/16  POCT Urinalysis Dipstick  Result Value Ref Range   Color, UA amber    Clarity, UA clear    Glucose, UA negative    Bilirubin, UA negative    Ketones, UA negative    Spec Grav, UA 1.010    Blood, UA negative    pH, UA 5.0    Protein, UA negative    Urobilinogen, UA negative    Nitrite, UA negative    Leukocytes, UA Negative Negative      Assessment & Plan:   Problem List Items Addressed This Visit      Other   Vitamin D deficiency    Recheck levels today.       Relevant Orders   Vitamin D (25 hydroxy)    Other Visit Diagnoses    Flank pain    -  Primary   UA negative. likely MSK but will culture urine. Unlikely glomerular nephritis 2/2 strep.  Trial of advil for pain. Alarm symptoms reviewed. Return if not improv   Relevant Orders   POCT Urinalysis Dipstick (Completed)   COMPLETE METABOLIC PANEL WITH GFR   Urine culture   Urinalysis,  microscopic only   Chronic fatigue       May be illness and recovery related. Recheck vitamin D today.    Sore throat       Recently treated with azithromycin. Throat looks normal. Residual sore throat may be lymph node related due to description of deep pain. Consider ENT if symptoms don't resolve by Wednesday. Supportive care at home. Alarm symptoms reviewed. Return if not improving.    Relevant Orders   CBC with Differential      No orders of the defined types were placed in this encounter.     Follow up plan: Return if symptoms worsen or fail to improve.

## 2016-05-25 NOTE — Assessment & Plan Note (Signed)
Recheck levels today

## 2016-05-25 NOTE — Patient Instructions (Addendum)
Sore throat: I think it is residual lymph nodes from the infection.  If it doesn't resolve please let me know.  We will get you to ENT.   Back pain: Is likely your muscles- try motrin when it happens again.  If you develop severe flank pain, blood in the urine, fever, nausea or vomiting, please seek immediate medical attention in the ER.

## 2016-05-26 ENCOUNTER — Telehealth: Payer: Self-pay | Admitting: Family Medicine

## 2016-05-26 DIAGNOSIS — J020 Streptococcal pharyngitis: Secondary | ICD-10-CM

## 2016-05-26 LAB — CBC WITH DIFFERENTIAL/PLATELET
BASOS PCT: 1 %
Basophils Absolute: 152 cells/uL (ref 0–200)
EOS ABS: 456 {cells}/uL (ref 15–500)
Eosinophils Relative: 3 %
HEMATOCRIT: 40.3 % (ref 35.0–45.0)
Hemoglobin: 13.5 g/dL (ref 11.7–15.5)
LYMPHS ABS: 4560 {cells}/uL — AB (ref 850–3900)
Lymphocytes Relative: 30 %
MCH: 29.4 pg (ref 27.0–33.0)
MCHC: 33.5 g/dL (ref 32.0–36.0)
MCV: 87.8 fL (ref 80.0–100.0)
MONO ABS: 760 {cells}/uL (ref 200–950)
MPV: 10.1 fL (ref 7.5–12.5)
Monocytes Relative: 5 %
NEUTROS ABS: 9272 {cells}/uL — AB (ref 1500–7800)
Neutrophils Relative %: 61 %
Platelets: 470 10*3/uL — ABNORMAL HIGH (ref 140–400)
RBC: 4.59 MIL/uL (ref 3.80–5.10)
RDW: 13.4 % (ref 11.0–15.0)
WBC: 15.2 10*3/uL — AB (ref 3.8–10.8)

## 2016-05-26 LAB — URINALYSIS, MICROSCOPIC ONLY
Casts: NONE SEEN [LPF]
Crystals: NONE SEEN [HPF]
YEAST: NONE SEEN [HPF]

## 2016-05-26 LAB — VITAMIN D 25 HYDROXY (VIT D DEFICIENCY, FRACTURES): VIT D 25 HYDROXY: 48 ng/mL (ref 30–100)

## 2016-05-26 LAB — VITAMIN B12: VITAMIN B 12: 346 pg/mL (ref 200–1100)

## 2016-05-26 MED ORDER — AZITHROMYCIN 250 MG PO TABS: 250.0000 mg | ORAL_TABLET | Freq: Every day | ORAL | 0 refills | Status: DC

## 2016-05-26 NOTE — Telephone Encounter (Signed)
Called to review labs. Given elevated white count and still sore- will extend out Zpak per pharmacy recommendation. 250mg  once daily for 5 days.  Pt is worried about mono due to fatigue- will add on monospot.   UA shows moderate bacteria- await urine culture. If not back tomorrow- may treat empirically with macrobid.

## 2016-05-26 NOTE — Telephone Encounter (Signed)
Message addressed.

## 2016-05-26 NOTE — Telephone Encounter (Signed)
Pt did lab work yesterday and she called to ask if it was too late to add an additional test to check for mono.  Her call back number is [phone]

## 2016-05-27 ENCOUNTER — Telehealth: Payer: Self-pay | Admitting: Family Medicine

## 2016-05-27 DIAGNOSIS — N3000 Acute cystitis without hematuria: Secondary | ICD-10-CM

## 2016-05-27 LAB — URINE CULTURE

## 2016-05-27 LAB — MONONUCLEOSIS SCREEN: Heterophile, Mono Screen: NEGATIVE

## 2016-05-27 MED ORDER — NITROFURANTOIN MONOHYD MACRO 100 MG PO CAPS: 100.0000 mg | ORAL_CAPSULE | Freq: Two times a day (BID) | ORAL | 0 refills | Status: DC

## 2016-05-27 NOTE — Telephone Encounter (Signed)
Called to review labs with patient. Urine showed mixed urogenital pee. Still having L flank pain. Will treat with Macrobid.   Throat is better from her Monday. She is still taking Z pak.   If she has symptoms of yeast infection- it is okay to send in diflucan 150mg  once in my name.

## 2016-06-05 ENCOUNTER — Telehealth: Payer: Self-pay | Admitting: Family Medicine

## 2016-06-05 NOTE — Telephone Encounter (Signed)
Called patient in response to question from Rachell. She does not need follow-up lab work. Her symptoms are doing much better. Throat is much better after longer course of azithromcyin.  No further labs or follow-up needed unless symptoms don't improve.

## 2016-07-01 ENCOUNTER — Other Ambulatory Visit: Payer: Self-pay | Admitting: Family Medicine

## 2016-07-01 DIAGNOSIS — J4540 Moderate persistent asthma, uncomplicated: Secondary | ICD-10-CM

## 2016-07-01 MED ORDER — ALBUTEROL SULFATE HFA 108 (90 BASE) MCG/ACT IN AERS: 2.0000 | INHALATION_SPRAY | Freq: Four times a day (QID) | RESPIRATORY_TRACT | 5 refills | Status: DC | PRN

## 2016-07-03 ENCOUNTER — Telehealth: Payer: Self-pay | Admitting: Family Medicine

## 2016-07-03 DIAGNOSIS — Z3044 Encounter for surveillance of vaginal ring hormonal contraceptive device: Secondary | ICD-10-CM

## 2016-07-03 DIAGNOSIS — IMO0001 Reserved for inherently not codable concepts without codable children: Secondary | ICD-10-CM | POA: Insufficient documentation

## 2016-07-03 MED ORDER — NUVARING 0.12-0.015 MG/24HR VA RING: 1.0000 | VAGINAL_RING | VAGINAL | 11 refills | Status: DC

## 2016-07-03 NOTE — Telephone Encounter (Signed)
Rx Nuva ring refill sent to pharmacy for 1 year supply.

## 2016-07-03 NOTE — Telephone Encounter (Signed)
Pt needs a new prescription for nuva ring [facility] [place].  Her call back number is [phone]

## 2016-12-02 ENCOUNTER — Telehealth: Payer: Self-pay | Admitting: Family Medicine

## 2016-12-02 DIAGNOSIS — J4540 Moderate persistent asthma, uncomplicated: Secondary | ICD-10-CM

## 2016-12-02 MED ORDER — ALBUTEROL SULFATE HFA 108 (90 BASE) MCG/ACT IN AERS: 2.0000 | INHALATION_SPRAY | Freq: Four times a day (QID) | RESPIRATORY_TRACT | 5 refills | Status: DC | PRN

## 2016-12-02 NOTE — Telephone Encounter (Signed)
Refilled Albuterol inhaler. Initial refill incorrectly sent to the preferred pharmacy of [facility], however patient is requesting to be sent to different pharmacy, re-sent rx to [facility]

## 2016-12-02 NOTE — Telephone Encounter (Signed)
Pt needs a refill on albuterol sent to [facility] [address].  Her call back number is [phone]

## 2017-01-19 ENCOUNTER — Encounter: Payer: BLUE CROSS/BLUE SHIELD | Admitting: Family Medicine

## 2017-06-30 ENCOUNTER — Ambulatory Visit: Payer: BLUE CROSS/BLUE SHIELD | Admitting: Nurse Practitioner

## 2017-12-30 ENCOUNTER — Encounter: Payer: Self-pay | Admitting: Nurse Practitioner

## 2017-12-30 ENCOUNTER — Ambulatory Visit: Payer: Self-pay | Admitting: Nurse Practitioner

## 2017-12-30 ENCOUNTER — Other Ambulatory Visit: Payer: Self-pay

## 2017-12-30 VITALS — BP 156/69 | HR 61 | Temp 98.3°F | Resp 18 | Ht 62.0 in | Wt 150.4 lb

## 2017-12-30 DIAGNOSIS — R0789 Other chest pain: Secondary | ICD-10-CM

## 2017-12-30 DIAGNOSIS — J4521 Mild intermittent asthma with (acute) exacerbation: Secondary | ICD-10-CM

## 2017-12-30 DIAGNOSIS — M546 Pain in thoracic spine: Secondary | ICD-10-CM

## 2017-12-30 MED ORDER — PREDNISONE 50 MG PO TABS: 50.0000 mg | ORAL_TABLET | Freq: Every day | ORAL | 0 refills | Status: AC

## 2017-12-30 MED ORDER — BUDESONIDE 180 MCG/ACT IN AEPB: 1.0000 | INHALATION_SPRAY | Freq: Two times a day (BID) | RESPIRATORY_TRACT | 5 refills | Status: DC

## 2017-12-30 NOTE — Progress Notes (Signed)
Subjective:    Patient ID: Karen Martinez, female    DOB: Apr 19, 1966, 52 y.o.   MRN: [id number]  Karen Martinez is a 52 y.o. female presenting on 12/30/2017 for Shortness of Breath (x 1 week. history of asthma. symptoms started off with sore throat, cough and increase into SOB. ) and URI (seen at the Urgent Care x 2 days ago, put on Minocycline ,guafension )   HPI Asthma Exacerbation Had sudden onset of shortness of breath about 4 days ago.  Symptoms tarted with sore throat, sinus pressure, lightheadedness/dizziness.  Then had more rapid onset of shortness of breath overnight - ribcage soreness, and heaviness in chest and shoulder blades.  Patient received care at an urgent care 2 days ago.  She was diagnosed with bronchitis or pneumonia and provided antibiotics and guaifenesin.  Did not do Chest Xray, no EKG.  Is still having some chest pressure - not increased and not improving.  Mild improvement in pain between shoulder blades.  Worsened overnight.  Relieved by acetaminophen.  Some relief of breathing with albuterol that lasts only about 2 hours.  No notice of relief of back pain.  Did take guaifenesin, but no difference - is now with dry cough.  Congested cough off and on.  She has not taken her antibiotic.  Social History   Tobacco Use  . Smoking status: Never Smoker  . Smokeless tobacco: Never Used  Substance Use Topics  . Alcohol use: Yes  . Drug use: No    Review of Systems Per HPI unless specifically indicated above     Objective:    BP (!) 156/69 (BP Location: Right Arm, Patient Position: Sitting, Cuff Size: Normal)   Pulse 61   Temp 98.3 F (36.8 C) (Oral)   Resp 18   Ht  (1.575 m)   Wt 150 lb 6.4 oz (68.2 kg)   SpO2 100%   BMI 27.51 kg/m   Wt Readings from Last 3 Encounters:  12/30/17 150 lb 6.4 oz (68.2 kg)  05/25/16 164 lb (74.4 kg)  05/18/16 162 lb 3.2 oz (73.6 kg)    Physical Exam  Constitutional: She is oriented to person, place, and time. She  appears well-developed and well-nourished. No distress.  HENT:  Head: Normocephalic and atraumatic.  Neck: Normal range of motion. Neck supple. Carotid bruit is not present.  Cardiovascular: Normal rate, regular rhythm, S1 normal, S2 normal, normal heart sounds and intact distal pulses.  Pulmonary/Chest: Effort normal. No respiratory distress. She has no decreased breath sounds. She has wheezes in the right lower field. She has no rhonchi. She has no rales.  Musculoskeletal: She exhibits no edema (pedal).  Neurological: She is alert and oriented to person, place, and time.  Skin: Skin is warm and dry. She is not diaphoretic.  Psychiatric: She has a normal mood and affect. Her behavior is normal.  Vitals reviewed.    Results for orders placed or performed in visit on 05/25/16  Mononucleosis screen  Result Value Ref Range   Heterophile, Mono Screen NEGATIVE Negative  Vitamin B12  Result Value Ref Range   Vitamin B-12 346 200 - 1,100 pg/mL   12-lead EKG: Normal sinus rhythm     Assessment & Plan:   Problem List Items Addressed This Visit    None    Visit Diagnoses    Chest heaviness    -  Primary   Relevant Orders   EKG 12-Lead   Acute thoracic back pain, unspecified back  pain laterality       Relevant Medications   predniSONE (DELTASONE) 50 MG tablet   Other Relevant Orders   EKG 12-Lead   Mild intermittent asthma with acute exacerbation       Relevant Medications   budesonide (PULMICORT FLEXHALER) 180 MCG/ACT inhaler   predniSONE (DELTASONE) 50 MG tablet    Acute shortness of breath x4 days.  Considered acute MI versus asthma exacerbation.  EKG showed normal sinus rhythm.  Most likely is asthma exacerbation worsened by allergies.  Sore throat also likely to allergies prior to the shortness of breath.  Patient is not taking Symbicort regularly.  Albuterol relieves symptoms for 2 hours, which also supports this diagnosis.  Plan: 1.  Start Pulmicort 1 puff twice daily 2.   Continue albuterol 1 to 2 puffs every 4 hours as needed for shortness of breath or wheezing. 3.  Consider repeat spirometry. 4.  EKG in clinic today to rule out acute MI 5.  Start prednisone 50 mg x 5 days 6.  Follow-up as needed and at least every 6 months for asthma management.  Suggest follow-up in 3 months if possible.   Meds ordered this encounter  Medications  . budesonide (PULMICORT FLEXHALER) 180 MCG/ACT inhaler    Sig: Inhale 1 puff into the lungs 2 (two) times daily.    Dispense:  1 Inhaler    Refill:  5    Order Specific Question:   Supervising Provider    Answer:   Smitty Cords [[id number]]  . predniSONE (DELTASONE) 50 MG tablet    Sig: Take 1 tablet (50 mg total) by mouth daily with breakfast for 5 days.    Dispense:  5 tablet    Refill:  0    Order Specific Question:   Supervising Provider    Answer:   Smitty Cords [[id number]]    Follow up plan: Return in about 3 months (around 04/01/2018) for asthma.  [name], DNP, AGPCNP-BC Adult Gerontology Primary Care Nurse Practitioner [facility] 12/30/2017, 5:48 PM

## 2017-12-30 NOTE — Patient Instructions (Addendum)
Karen Martinez,   Thank you for coming in to clinic today.  1. You most likely have an asthma exacerbation. - START taking Pulmicort 1 puff twice daily.  Continue to provide good asthma control.  Rinse mouth with water, swish, and spit water out after each dose of pulmicort. - Continue albuterol 1-2 puffs every 4 hours prn for shortness of breath.  Please schedule a follow-up appointment with [name], AGNP. Return in about 3 months (around 04/01/2018) for asthma.  If you have any other questions or concerns, please feel free to call the clinic or send a message through MyChart. You may also schedule an earlier appointment if necessary.  You will receive a survey after today's visit either digitally by e-mail or paper by [employer]. Your experiences and feedback matter to [place].  Please respond so we know how we are doing as we provide care for you.   [name], DNP, AGNP-BC Adult Gerontology Nurse Practitioner [facility]   Asthma, Adult Asthma is a recurring condition in which the airways tighten and narrow. Asthma can make it difficult to breathe. It can cause coughing, wheezing, and shortness of breath. Asthma episodes, also called asthma attacks, range from minor to life-threatening. Asthma cannot be cured, but medicines and lifestyle changes can help control it. What are the causes? Asthma is believed to be caused by inherited (genetic) and environmental factors, but its exact cause is unknown. Asthma may be triggered by allergens, lung infections, or irritants in the air. Asthma triggers are different for each person. Common triggers include:  Animal dander.  Dust mites.  Cockroaches.  Pollen from trees or grass.  Mold.  Smoke.  Air pollutants such as dust, household cleaners, hair sprays, aerosol sprays, paint fumes, strong chemicals, or strong odors.  Cold air, weather changes, and winds (which increase molds and pollens in the air).  Strong  emotional expressions such as crying or laughing hard.  Stress.  Certain medicines (such as aspirin) or types of drugs (such as beta-blockers).  Sulfites in foods and drinks. Foods and drinks that may contain sulfites include dried fruit, potato chips, and sparkling grape juice.  Infections or inflammatory conditions such as the flu, a cold, or an inflammation of the nasal membranes (rhinitis).  Gastroesophageal reflux disease (GERD).  Exercise or strenuous activity.  What are the signs or symptoms? Symptoms may occur immediately after asthma is triggered or many hours later. Symptoms include:  Wheezing.  Excessive nighttime or early morning coughing.  Frequent or severe coughing with a common cold.  Chest tightness.  Shortness of breath.  How is this diagnosed? The diagnosis of asthma is made by a review of your medical history and a physical exam. Tests may also be performed. These may include:  Lung function studies. These tests show how much air you breathe in and out.  Allergy tests.  Imaging tests such as X-rays.  How is this treated? Asthma cannot be cured, but it can usually be controlled. Treatment involves identifying and avoiding your asthma triggers. It also involves medicines. There are 2 classes of medicine used for asthma treatment:  Controller medicines. These prevent asthma symptoms from occurring. They are usually taken every day.  Reliever or rescue medicines. These quickly relieve asthma symptoms. They are used as needed and provide short-term relief.  Your health care provider will help you create an asthma action plan. An asthma action plan is a written plan for managing and treating your asthma attacks.  It includes a list of your asthma triggers and how they may be avoided. It also includes information on when medicines should be taken and when their dosage should be changed. An action plan may also involve the use of a device called a peak flow  meter. A peak flow meter measures how well the lungs are working. It helps you monitor your condition. Follow these instructions at home:  Take medicines only as directed by your health care provider. Speak with your health care provider if you have questions about how or when to take the medicines.  Use a peak flow meter as directed by your health care provider. Record and keep track of readings.  Understand and use the action plan to help minimize or stop an asthma attack without needing to seek medical care.  Control your home environment in the following ways to help prevent asthma attacks: ? Do not smoke. Avoid being exposed to secondhand smoke. ? Change your heating and air conditioning filter regularly. ? Limit your use of fireplaces and wood stoves. ? Get rid of pests (such as roaches and mice) and their droppings. ? Throw away plants if you see mold on them. ? Clean your floors and dust regularly. Use unscented cleaning products. ? Try to have someone else vacuum for you regularly. Stay out of rooms while they are being vacuumed and for a short while afterward. If you vacuum, use a dust mask from a hardware store, a double-layered or microfilter vacuum cleaner bag, or a vacuum cleaner with a HEPA filter. ? Replace carpet with wood, tile, or vinyl flooring. Carpet can trap dander and dust. ? Use allergy-proof pillows, mattress covers, and box spring covers. ? Wash bed sheets and blankets every week in hot water and dry them in a dryer. ? Use blankets that are made of polyester or cotton. ? Clean bathrooms and kitchens with bleach. If possible, have someone repaint the walls in these rooms with mold-resistant paint. Keep out of the rooms that are being cleaned and painted. ? Wash hands frequently. Contact a health care provider if:  You have wheezing, shortness of breath, or a cough even if taking medicine to prevent attacks.  The colored mucus you cough up (sputum) is thicker than  usual.  Your sputum changes from clear or white to yellow, green, gray, or bloody.  You have any problems that may be related to the medicines you are taking (such as a rash, itching, swelling, or trouble breathing).  You are using a reliever medicine more than 2-3 times per week.  Your peak flow is still at 50-79% of your personal best after following your action plan for 1 hour.  You have a fever. Get help right away if:  You seem to be getting worse and are unresponsive to treatment during an asthma attack.  You are short of breath even at rest.  You get short of breath when doing very little physical activity.  You have difficulty eating, drinking, or talking due to asthma symptoms.  You develop chest pain.  You develop a fast heartbeat.  You have a bluish color to your lips or fingernails.  You are light-headed, dizzy, or faint.  Your peak flow is less than 50% of your personal best. This information is not intended to replace advice given to you by your health care provider. Make sure you discuss any questions you have with your health care provider. Document Released: 08/17/2005 Document Revised: 01/29/2016 Document Reviewed: 03/16/2013 Elsevier Interactive  Patient Education  2017 Elsevier Inc.  

## 2018-01-04 ENCOUNTER — Telehealth: Payer: Self-pay | Admitting: Family Medicine

## 2018-01-04 DIAGNOSIS — B850 Pediculosis due to Pediculus humanus capitis: Secondary | ICD-10-CM

## 2018-01-04 MED ORDER — PERMETHRIN 1 % EX LOTN: TOPICAL_LOTION | CUTANEOUS | 0 refills | Status: DC

## 2018-01-04 NOTE — Telephone Encounter (Signed)
Covering inbox for [name], AGPCNP-BC while she is out of office.  Patient called, last seen by Lauren on 12/30/17 for other complaints. Now calls with new problem of head lice, she states she got it from her 50 year old daughter. She was advised to contact her Pediatrician for her daughter.  Since she was recently seen, I agreed to send a topical treatment x 1 - to her pharmacy, future may need to follow-up  Permethrin Topical: Cream rinse/lotion 1%: Prior to application, wash hair with conditioner-free shampoo; rinse with water and towel dry. Apply a sufficient amount of lotion or cream rinse to saturate the hair and scalp (especially behind the ears and nape of neck). Leave on hair for 10 minutes (but no longer), then rinse off with warm water; remove remaining nits with nit comb. A single application is generally sufficient; however, may repeat 7 days after first treatment if lice or nits are still present.  Saralyn Pilar, DO [facility] 01/04/2018, 1:11 PM

## 2018-02-28 ENCOUNTER — Telehealth: Payer: Self-pay | Admitting: Nurse Practitioner

## 2018-02-28 DIAGNOSIS — B850 Pediculosis due to Pediculus humanus capitis: Secondary | ICD-10-CM

## 2018-02-28 MED ORDER — PERMETHRIN 1 % EX LOTN: TOPICAL_LOTION | CUTANEOUS | 1 refills | Status: DC

## 2018-02-28 NOTE — Telephone Encounter (Addendum)
After review with Dr. Kirtland BouchardK and Morrie SheldonAshley,  Will treat patient without office visit.  Refill sent.  If not fully gone after this treatment, will need to send alternative medication.  Other option for treatment is very expensive.

## 2018-02-28 NOTE — Telephone Encounter (Signed)
Pt has questions about getting rid of lice.  Her call back number is [phone]

## 2018-02-28 NOTE — Telephone Encounter (Signed)
I spoke w/ the pt and she informed me that she tired several treatment, but the lice keep coming back. She wanted a referral to a specialist or a stronger medication. I informed the pt that we have never seen her in the office for the lice and since after treatment her symptoms did not resolve she needs to be seen in the office and treated. She verbalize understanding, appt scheduled for tomorrow at 11am.

## 2018-02-28 NOTE — Telephone Encounter (Addendum)
The pt was notified. She wanted to know if the decision was because we do not want to see her in the office. I stress to her that it was not because we didn't want to see her. The provider just recommends one more treatment before trying an alternative medication that is very expensive. She verbal understanding.

## 2018-02-28 NOTE — Addendum Note (Signed)
Addended by: Wilhelmina McardleKENNEDY, Kento Gossman R on: 02/28/2018 01:08 PM   Modules accepted: Orders

## 2018-02-28 NOTE — Telephone Encounter (Signed)
Attempted to contact the pt, no answer. LMOM to return my call.  

## 2018-03-01 ENCOUNTER — Ambulatory Visit: Payer: Self-pay | Admitting: Nurse Practitioner

## 2018-04-01 ENCOUNTER — Ambulatory Visit (INDEPENDENT_AMBULATORY_CARE_PROVIDER_SITE_OTHER): Payer: Self-pay | Admitting: Nurse Practitioner

## 2018-04-01 VITALS — BP 130/80 | Resp 16 | Ht 62.0 in | Wt 146.2 lb

## 2018-04-01 DIAGNOSIS — M19042 Primary osteoarthritis, left hand: Secondary | ICD-10-CM

## 2018-04-01 DIAGNOSIS — M19041 Primary osteoarthritis, right hand: Secondary | ICD-10-CM

## 2018-04-01 DIAGNOSIS — J4530 Mild persistent asthma, uncomplicated: Secondary | ICD-10-CM

## 2018-04-01 MED ORDER — MONTELUKAST SODIUM 10 MG PO TABS: 10.0000 mg | ORAL_TABLET | Freq: Every day | ORAL | 3 refills | Status: DC

## 2018-04-01 NOTE — Patient Instructions (Addendum)
Karen Martinez,   Thank you for coming in to clinic today.  1. Continue albuterol as needed.  2. START montelukast 10 mWesley Desanctisg once daily for allergies and asthma.  3. For your joints:  - START Aleeve (naproxen sodium) 220 mg one tablet twice daily for 14 days.  Please schedule a follow-up appointment with Wilhelmina McardleLauren Azul Brumett, AGNP. Return in about 6 months (around 10/02/2018) for asthma.  If you have any other questions or concerns, please feel free to call the clinic or send a message through MyChart. You may also schedule an earlier appointment if necessary.  You will receive a survey after today's visit either digitally by e-mail or paper by [employer] mail. Your experiences and feedback matter to us.  Please respond so we know how we are doing as we provide care for you.   Wilhelmina McardleLauren Batul Diego, DNP, AGNP-BC Adult Gerontology Nurse Practitioner [facility], [id number]

## 2018-04-01 NOTE — Progress Notes (Signed)
Subjective:    Patient ID: Karen Martinez, female    DOB: November 19, 1965, 52 y.o.   MRN: [id number]  Karen Martinez is a 52 y.o. female presenting on 04/01/2018 for Asthma (follow up)   HPI Asthma Patient reports stable  asthma over last several months, but continues to use albuterol prn on nearly daily basis 1-2 times daily.  She has cough, shortness of breath that is worse at night.  The family has a cat that has access to bedrooms, including patient's.  Daughter also has asthma and cat no longer in daughter's bedroom.  Patient is willing to also make same change for herself.    Lice treatment -  Successful (olive oil plastic wrap, shower cap overnight).  Patient has no additional concerns.  Swollen joints with pain in hands First digit DIP pain in bilateral first digits.  Right 2nd DIP with swollen node and redness.  Painful with tapping motion.  Patient has had this for last 3-4 weeks, progressively worsening.  She has not taken anything for this to improve symptoms.  Pain is described as aching, throbbing at times and of moderate intensity.  Social History   Tobacco Use  . Smoking status: Never Smoker  . Smokeless tobacco: Never Used  Substance Use Topics  . Alcohol use: Yes  . Drug use: No    Review of Systems Per HPI unless specifically indicated above     Objective:    BP 130/80 (BP Location: Left Arm, Patient Position: Sitting, Cuff Size: Normal)   Resp 16   Ht 5\' 2"  (1.575 m)   Wt 146 lb 3.2 oz (66.3 kg)   BMI 26.74 kg/m   Wt Readings from Last 3 Encounters:  04/01/18 146 lb 3.2 oz (66.3 kg)  12/30/17 150 lb 6.4 oz (68.2 kg)  05/25/16 164 lb (74.4 kg)    Physical Exam  Constitutional: She is oriented to person, place, and time. She appears well-developed and well-nourished. No distress.  HENT:  Head: Normocephalic and atraumatic.  Cardiovascular: Normal rate, regular rhythm, S1 normal, S2 normal, normal heart sounds and intact distal pulses.  Pulmonary/Chest:  Effort normal. No respiratory distress. She has decreased breath sounds (throughout all lobes). She has no wheezes. She has no rhonchi. She has no rales.  Musculoskeletal:  Inspection: first digit DIP pain bilateral hands, R 2nd digit DIP with Heberden's node. Palpation: nondentder ROM: intact AROM flex/extension of finger at DIP joints and PIP joints of all fingers Special Testing: none Strength: normal grip strength Neurovascular: intact sensation, good cap refill   Neurological: She is alert and oriented to person, place, and time.  Skin: Skin is warm and dry. Capillary refill takes less than 2 seconds.  Psychiatric: Her behavior is normal. Judgment and thought content normal. Her mood appears anxious. Her speech is rapid and/or pressured.  Vitals reviewed.    Results for orders placed or performed in visit on 05/25/16  Mononucleosis screen  Result Value Ref Range   Heterophile, Mono Screen NEGATIVE Negative  Vitamin B12  Result Value Ref Range   Vitamin B-12 346 200 - 1,100 pg/mL      Assessment & Plan:   Problem List Items Addressed This Visit      Respiratory   Mild persistent asthma without complication - Primary Stable, but remains suboptimally controlled today on exam.  Medications tolerated without side effects.  Continue albuterol at current dose.  START montelukast 10 mg once daily.Marland Kitchen.  Refills provided.  Encouraged pt to launder  all linens in bedroom, carefully/thoroughly vacuum carpets. Followup 6 months.    Relevant Medications   montelukast (SINGULAIR) 10 MG tablet     Musculoskeletal and Integument   Primary osteoarthritis of both hands Pain likely chronic, worsened by repetitive use.    Plan:  1. Treat with OTC pain meds (acetaminophen and Aleeve).  Discussed alternate dosing and max dosing. - take Aleeve 220-440 mg bid x 14 days then prn for mild to moderate pain 2. Apply heat and/or ice to affected area. 3. Follow up 6 months as needed.       Meds  ordered this encounter  Medications  . montelukast (SINGULAIR) 10 MG tablet    Sig: Take 1 tablet (10 mg total) by mouth at bedtime.    Dispense:  30 tablet    Refill:  3    Order Specific Question:   Supervising Provider    Answer:   Smitty Cords [[id number]]    Follow up plan: Return in about 6 months (around 10/02/2018) for asthma.  [name], DNP, AGPCNP-BC Adult Gerontology Primary Care Nurse Practitioner [facility] 04/01/2018, 11:20 AM

## 2018-04-27 ENCOUNTER — Encounter: Payer: Self-pay | Admitting: Nurse Practitioner

## 2018-04-27 DIAGNOSIS — M19042 Primary osteoarthritis, left hand: Secondary | ICD-10-CM | POA: Insufficient documentation

## 2018-04-27 DIAGNOSIS — J4530 Mild persistent asthma, uncomplicated: Secondary | ICD-10-CM | POA: Insufficient documentation

## 2018-04-27 DIAGNOSIS — M19041 Primary osteoarthritis, right hand: Secondary | ICD-10-CM | POA: Insufficient documentation

## 2018-10-11 ENCOUNTER — Ambulatory Visit: Payer: Self-pay | Admitting: Nurse Practitioner

## 2019-09-15 ENCOUNTER — Encounter: Payer: Self-pay | Admitting: Family Medicine

## 2019-09-15 ENCOUNTER — Ambulatory Visit (INDEPENDENT_AMBULATORY_CARE_PROVIDER_SITE_OTHER): Payer: Self-pay | Admitting: Family Medicine

## 2019-09-15 ENCOUNTER — Other Ambulatory Visit: Payer: Self-pay

## 2019-09-15 VITALS — BP 133/81 | HR 70 | Temp 98.4°F | Resp 16 | Ht 63.0 in | Wt 155.0 lb

## 2019-09-15 DIAGNOSIS — R109 Unspecified abdominal pain: Secondary | ICD-10-CM

## 2019-09-15 DIAGNOSIS — M549 Dorsalgia, unspecified: Secondary | ICD-10-CM

## 2019-09-15 LAB — POCT URINALYSIS DIPSTICK
Bilirubin, UA: NEGATIVE
Blood, UA: NEGATIVE
Glucose, UA: NEGATIVE
Ketones, UA: NEGATIVE
Nitrite, UA: NEGATIVE
Protein, UA: NEGATIVE
Spec Grav, UA: 1.01 (ref 1.010–1.025)
Urobilinogen, UA: 0.2 E.U./dL
pH, UA: 7 (ref 5.0–8.0)

## 2019-09-15 NOTE — Progress Notes (Signed)
Subjective:    Patient ID: Karen Martinez, female    DOB: 01/22/1966, 54 y.o.   MRN: [id number]  Karen Martinez is a 54 y.o. female presenting on 09/15/2019 for Back Pain   HPI   Left Mid Back Flank Pain Reports that symptoms started 3 days ago, acute onset without other trigger, has had persistent mild dull aching pain, worse in evening. Seems to not be worsening. Now due to persistent symptoms she decided to contact our office. - She has history of UTI in past. Typically has a significant pain in pelvic area - She has been able to reproduce the pain with some movement and thinks it could possibly be her back, she was more active other day and it was more sore. She has tried to adjust pillows at times to help it with some relief. - Taking Tylenol 500mg  x 2 with some relief to help sleep at night.   Denies any known or suspected exposure to person with or possibly with COVID19.  Denies any fevers, chills, sweats, body ache, cough, shortness of breath, sinus pain or pressure, headache, abdominal pain, diarrhea   Depression screen [facility] 2/9 10/21/2015 09/17/2015 09/17/2015  Decreased Interest 1 0 0  Down, Depressed, Hopeless 1 0 0  PHQ - 2 Score 2 0 0  Altered sleeping 2 1 -  Tired, decreased energy 2 1 -  Change in appetite 0 1 -  Feeling bad or failure about yourself  1 1 -  Trouble concentrating 0 1 -  Moving slowly or fidgety/restless 0 0 -  Suicidal thoughts 0 0 -  PHQ-9 Score 7 5 -  Difficult doing work/chores - Somewhat difficult -    Social History   Tobacco Use  . Smoking status: Never Smoker  . Smokeless tobacco: Never Used  Substance Use Topics  . Alcohol use: Yes  . Drug use: No    Review of Systems Per HPI unless specifically indicated above     Objective:    BP 133/81   Pulse 70   Temp 98.4 F (36.9 C) (Oral)   Resp 16   Ht 5\' 3"  (1.6 m)   Wt 155 lb (70.3 kg)   BMI 27.46 kg/m   Wt Readings from Last 3 Encounters:  09/15/19 155 lb (70.3 kg)    04/01/18 146 lb 3.2 oz (66.3 kg)  12/30/17 150 lb 6.4 oz (68.2 kg)    Physical Exam Vitals and nursing note reviewed.  Constitutional:      General: She is not in acute distress.    Appearance: She is well-developed. She is not diaphoretic.     Comments: Well-appearing, comfortable, cooperative  HENT:     Head: Normocephalic and atraumatic.  Eyes:     General:        Right eye: No discharge.        Left eye: No discharge.     Conjunctiva/sclera: Conjunctivae normal.  Cardiovascular:     Rate and Rhythm: Normal rate.  Pulmonary:     Effort: Pulmonary effort is normal.  Musculoskeletal:     Comments: Mid Back Inspection: Normal appearance, no spinal deformity, symmetrical. Palpation: No tenderness over spinous processes. Some mild reproduced tenderness location of pain seems very localized, muscle hypertonicity paraspinal mid thoracolumbar near rib angle ROM: Full active ROM forward flex / back extension, rotation L/R without discomfort Strength: Bilateral hip flex/ext 5/5, knee flex/ext 5/5, ankle dorsiflex/plantarflex 5/5 Neurovascular: intact distal sensation to light touch   Skin:  General: Skin is warm and dry.     Findings: No erythema or rash.  Neurological:     Mental Status: She is alert and oriented to person, place, and time.  Psychiatric:        Behavior: Behavior normal.     Comments: Well groomed, good eye contact, normal speech and thoughts    Results for orders placed or performed in visit on 09/15/19  POCT Urinalysis Dipstick  Result Value Ref Range   Color, UA yellow    Clarity, UA clear    Glucose, UA Negative Negative   Bilirubin, UA negative    Ketones, UA negative    Spec Grav, UA 1.010 1.010 - 1.025   Blood, UA negative    pH, UA 7.0 5.0 - 8.0   Protein, UA Negative Negative   Urobilinogen, UA 0.2 0.2 or 1.0 E.U./dL   Nitrite, UA negative    Leukocytes, UA Trace (A) Negative   Appearance     Odor        Assessment & Plan:   Problem  List Items Addressed This Visit    None    Visit Diagnoses    Acute left flank pain    -  Primary   Relevant Orders   POCT Urinalysis Dipstick (Completed)   [facility] - Urine culture   Mid back pain on left side           Acute Left mid back pain near rib/flank without scatica Suspect likely due to muscle spasm/strain, without known injury or trauma. Has had prior back pain flare up in the past, but not in this area. She has seen chiropractor before - No red flag symptoms. Negative for radicular pain extending - Inadequate conservative therapy  - improved on Tylenol  Additionally she was concerned for UTI, see HPI. UA showed trace leuks. No nitrite. No RBC. No other clear cause based on lack of urinary symptoms, however will pursue Urine Culture.  Start high dose Tylenol 500mg  x 2 up to 3 times daily Offered NSAID or Muscle relaxant rx - she declined today can reconsider if need Encouraged use of heating pad 1-2x daily for now then PRN Back exercises  Follow-up 4-6 weeks if not improved for re-evaluation   No orders of the defined types were placed in this encounter.     Follow up plan: Return in about 2 weeks (around 09/29/2019), or if symptoms worsen or fail to improve, for flank back pain.   Nobie Putnam, [facility] 09/15/2019, 4:34 PM

## 2019-09-15 NOTE — Patient Instructions (Addendum)
Recommend to start taking Tylenol Extra Strength 500mg  tabs - take 1 to 2 tabs per dose (max 1000mg ) every 6-8 hours for pain (take regularly, don't skip a dose for next 7 days), max 24 hour daily dose is 6 tablets or 3000mg . In the future you can repeat the same everyday Tylenol course for 1-2 weeks at a time.  - This is safe to take with anti-inflammatory medicines (Ibuprofen, Advil, Naproxen, Aleve, Meloxicam, Mobic) - if you need to take these as well can do Aleve or Ibuprofen as needed.  Most likely muscle strain or back pain. Rather than UTI  We will send urine out, stay tuned for result next week  Urine dipstick did show very mild elevated leukocyte  Please schedule a Follow-up Appointment to: Return in about 2 weeks (around 09/29/2019), or if symptoms worsen or fail to improve, for flank back pain.  If you have any other questions or concerns, please feel free to call the office or send a message through MyChart. You may also schedule an earlier appointment if necessary.  Additionally, you may be receiving a survey about your experience at our office within a few days to 1 week by e-mail or mail. We value your feedback.  , DO [facility], [facility]             Low Back Pain Exercises  See other page with pictures of each exercise.  Start with 1 or 2 of these exercises that you are most comfortable with. Do not do any exercises that cause you significant worsening pain. Some of these may cause some "stretching soreness" but it should go away after you stop the exercise, and get better over time. Gradually increase up to 3-4 exercises as tolerated.  Standing hamstring stretch: Place the heel of your leg on a stool about 15 inches high. Keep your knee straight. Lean forward, bending at the hips until you feel a mild stretch in the back of your thigh. Make sure you do not roll your shoulders and bend at the waist when doing this or you will  stretch your lower back instead. Hold the stretch for 15 to 30 seconds. Repeat 3 times. Repeat the same stretch on your other leg.  Cat and camel: Get down on your hands and knees. Let your stomach sag, allowing your back to curve downward. Hold this position for 5 seconds. Then arch your back and hold for 5 seconds. Do 3 sets of 10.  Quadriped Arm/Leg Raises: Get down on your hands and knees. Tighten your abdominal muscles to stiffen your spine. While keeping your abdominals tight, raise one arm and the opposite leg away from you. Hold this position for 5 seconds. Lower your arm and leg slowly and alternate sides. Do this 10 times on each side.  Pelvic tilt: Lie on your back with your knees bent and your feet flat on the floor. Tighten your abdominal muscles and push your lower back into the floor. Hold this position for 5 seconds, then relax. Do 3 sets of 10.  Partial curl: Lie on your back with your knees bent and your feet flat on the floor. Tighten your stomach muscles and flatten your back against the floor. Tuck your chin to your chest. With your hands stretched out in front of you, curl your upper body forward until your shoulders clear the floor. Hold this position for 3 seconds. Don't hold your breath. It helps to breathe out as you lift your shoulders  up. Relax. Repeat 10 times. Build to 3 sets of 10. To challenge yourself, clasp your hands behind your head and keep your elbows out to the side.  Lower trunk rotation: Lie on your back with your knees bent and your feet flat on the floor. Tighten your abdominal muscles and push your lower back into the floor. Keeping your shoulders down flat, gently rotate your legs to one side, then the other as far as you can. Repeat 10 to 20 times.  Single knee to chest stretch: Lie on your back with your legs straight out in front of you. Bring one knee up to your chest and grasp the back of your thigh. Pull your knee toward your chest, stretching your  buttock muscle. Hold this position for 15 to 30 seconds and return to the starting position. Repeat 3 times on each side.  Double knee to chest: Lie on your back with your knees bent and your feet flat on the floor. Tighten your abdominal muscles and push your lower back into the floor. Pull both knees up to your chest. Hold for 5 seconds and repeat 10 to 20 times.

## 2019-09-17 LAB — URINE CULTURE
MICRO NUMBER:: 10047129
SPECIMEN QUALITY:: ADEQUATE

## 2019-11-13 ENCOUNTER — Ambulatory Visit: Payer: Self-pay | Admitting: Family Medicine

## 2019-11-14 ENCOUNTER — Other Ambulatory Visit: Payer: Self-pay

## 2019-11-14 ENCOUNTER — Encounter: Payer: Self-pay | Admitting: Family Medicine

## 2019-11-14 ENCOUNTER — Ambulatory Visit: Payer: Self-pay | Admitting: Family Medicine

## 2019-11-14 VITALS — BP 129/66 | HR 68 | Temp 97.7°F | Ht 63.0 in | Wt 157.4 lb

## 2019-11-14 DIAGNOSIS — M2550 Pain in unspecified joint: Secondary | ICD-10-CM | POA: Insufficient documentation

## 2019-11-14 DIAGNOSIS — H9191 Unspecified hearing loss, right ear: Secondary | ICD-10-CM

## 2019-11-14 DIAGNOSIS — R5383 Other fatigue: Secondary | ICD-10-CM | POA: Insufficient documentation

## 2019-11-14 DIAGNOSIS — R5382 Chronic fatigue, unspecified: Secondary | ICD-10-CM

## 2019-11-14 DIAGNOSIS — R0683 Snoring: Secondary | ICD-10-CM

## 2019-11-14 DIAGNOSIS — W57XXXS Bitten or stung by nonvenomous insect and other nonvenomous arthropods, sequela: Secondary | ICD-10-CM

## 2019-11-14 DIAGNOSIS — Z6827 Body mass index (BMI) 27.0-27.9, adult: Secondary | ICD-10-CM

## 2019-11-14 DIAGNOSIS — F331 Major depressive disorder, recurrent, moderate: Secondary | ICD-10-CM

## 2019-11-14 MED ORDER — FLUOXETINE HCL 20 MG PO TABS: 20.0000 mg | ORAL_TABLET | Freq: Every day | ORAL | 3 refills | Status: DC

## 2019-11-14 NOTE — Assessment & Plan Note (Signed)
Recurrent major depressive disorder.  Currently uncontrolled and unstable.  Requesting medications for treatment.  Has previously taken sertraline, fluoxetine, and effexor with two medications causing side effects.  Chart review completed with patient and sertraline had caused headaches, effexor had caused increased sleepiness and fluoxetine had originally given her a rash but then found out it was due to an environmental concern and tolerated this medication for a few months.  Is open to beginning fluoxetine 20mg  daily over the next month.

## 2019-11-14 NOTE — Assessment & Plan Note (Addendum)
Generalized joint pain x 2 + years.  Has a history of Osteoarthritis in the hands but this is effecting large joints throughout body.  [place] borne vector labs per patient request.  Labs ordered (ANA, CRP, ESR, Lyme, Babesia, Anaplasma, Ehrlichia)  Plan: 1) Have labs completed and we will contact you with the results 2) Can use Ibuprofen 600-861m every 6-8 hours as needed for joint pain, according to packaging directions. 3) We will plan to see you back in clinic in 4 weeks for re-evaluation

## 2019-11-14 NOTE — Progress Notes (Signed)
Subjective:    Patient ID: Karen Martinez, female    DOB: [birth year], 54 y.o.   MRN: [id number]  Karen Martinez is a 54 y.o. female presenting on 11/14/2019 for Joint Pain (intermittent stiffiness neck, shoulder, hip pain x 2 yrs), Fatigue, and Depression   HPI  [name] presents to clinic for evaluation of fatigue, sleep disturbances, depression and generalized joint pain.  States she has a history of depression, dating back many years, had tried a few different medications in 2016-2017 with Amy Krebs, NP and stated one of them worked well for her and was hoping to restart that medication.  Has had an increase in her depression with COVID, virtual learning, recent diagnosis of her father with Kidney Cancer.  Feels overwhelmed.    Has had difficulty sleeping and fatigue "forever".  Has been snoring and not waking up rested.  Is open to the discussion of an at home sleep study.  No changes in medications, food intake, exercise routine, or obligations.  Has been having generalized all over joint pain x 2 years.  Reports [place] and has had multiple ticks pulled off of her throughout the years.  Denies any erythema migrans, previous diagnosis of tick borne illness, or work up for this.  Has not taken anything for her symptoms.   Depression screen [facility] 2/9 11/14/2019 10/21/2015 09/17/2015  Decreased Interest 3 1 0  Down, Depressed, Hopeless 2 1 0  PHQ - 2 Score 5 2 0  Altered sleeping 2 2 1   Tired, decreased energy 3 2 1   Change in appetite 0 0 1  Feeling bad or failure about yourself  3 1 1   Trouble concentrating 0 0 1  Moving slowly or fidgety/restless 1 0 0  Suicidal thoughts 2 0 0  PHQ-9 Score 16 7 5   Difficult doing work/chores Very difficult - Somewhat difficult    Social History   Tobacco Use  . Smoking status: Never Smoker  . Smokeless tobacco: Never Used  Substance Use Topics  . Alcohol use: Yes    Alcohol/week: 5.0 standard drinks    Types: 5  Glasses of wine per week    Comment: 5 glasses wine a week   . Drug use: No    Review of Systems  Constitutional: Positive for fatigue. Negative for activity change, appetite change, chills, diaphoresis, fever and unexpected weight change.  HENT: Positive for hearing loss. Negative for congestion, dental problem, drooling, ear discharge, ear pain, facial swelling, mouth sores, nosebleeds, postnasal drip, rhinorrhea, sinus pressure, sinus pain, sneezing, sore throat, tinnitus, trouble swallowing and voice change.   Eyes: Negative.   Respiratory: Negative.   Cardiovascular: Negative.   Gastrointestinal: Negative.   Endocrine: Negative.   Genitourinary: Negative.   Musculoskeletal: Positive for arthralgias. Negative for back pain, gait problem, joint swelling, myalgias, neck pain and neck stiffness.  Skin: Negative.   Allergic/Immunologic: Negative.   Neurological: Negative.   Hematological: Negative.   Psychiatric/Behavioral: Positive for dysphoric mood and sleep disturbance. Negative for agitation, behavioral problems, confusion, decreased concentration, hallucinations, self-injury and suicidal ideas. The patient is nervous/anxious. The patient is not hyperactive.    Per HPI unless specifically indicated above     Objective:    BP 129/66 (BP Location: Right Arm, Patient Position: Sitting, Cuff Size: Normal)   Pulse 68   Temp 97.7 F (36.5 C) (Temporal)   Ht 5' 3"  (1.6 m)   Wt 157 lb 6.4 oz (71.4  kg)   BMI 27.88 kg/m   Wt Readings from Last 3 Encounters:  11/14/19 157 lb 6.4 oz (71.4 kg)  09/15/19 155 lb (70.3 kg)  04/01/18 146 lb 3.2 oz (66.3 kg)    Physical Exam Vitals reviewed.  Constitutional:      General: She is not in acute distress.    Appearance: Normal appearance. She is well-groomed and overweight. She is not ill-appearing or toxic-appearing.  HENT:     Head: Normocephalic.     Right Ear: Tympanic membrane, ear canal and external ear normal. There is no impacted  cerumen.     Left Ear: Tympanic membrane, ear canal and external ear normal. There is no impacted cerumen.     Nose: Nose normal. No congestion or rhinorrhea.     Mouth/Throat:     Lips: Pink.     Mouth: Mucous membranes are moist.     Pharynx: Oropharynx is clear. Uvula midline. No oropharyngeal exudate or posterior oropharyngeal erythema.  Eyes:     General: Lids are normal. Vision grossly intact. No scleral icterus.       Right eye: No discharge.        Left eye: No discharge.     Extraocular Movements: Extraocular movements intact.     Conjunctiva/sclera: Conjunctivae normal.     Pupils: Pupils are equal, round, and reactive to light.  Neck:     Thyroid: No thyroid mass or thyromegaly.  Cardiovascular:     Rate and Rhythm: Normal rate and regular rhythm.     Pulses: Normal pulses.          Dorsalis pedis pulses are 2+ on the right side and 2+ on the left side.       Posterior tibial pulses are 2+ on the right side and 2+ on the left side.     Heart sounds: Normal heart sounds. No murmur. No friction rub. No gallop.   Pulmonary:     Effort: Pulmonary effort is normal. No respiratory distress.     Breath sounds: Normal breath sounds.  Abdominal:     General: Abdomen is flat. Bowel sounds are normal. There is no distension.     Palpations: Abdomen is soft. There is no hepatomegaly, splenomegaly or mass.     Tenderness: There is no abdominal tenderness. There is no guarding or rebound.     Hernia: No hernia is present.  Musculoskeletal:        General: Normal range of motion.     Cervical back: Normal range of motion and neck supple. No tenderness.     Right lower leg: No edema.     Left lower leg: No edema.  Feet:     Right foot:     Skin integrity: Skin integrity normal.     Left foot:     Skin integrity: Skin integrity normal.  Lymphadenopathy:     Cervical: No cervical adenopathy.  Skin:    General: Skin is warm and dry.     Capillary Refill: Capillary refill takes  less than 2 seconds.  Neurological:     General: No focal deficit present.     Mental Status: She is alert and oriented to person, place, and time.     Cranial Nerves: No cranial nerve deficit.     Sensory: No sensory deficit.     Motor: No weakness.     Coordination: Coordination normal.     Gait: Gait normal.     Deep Tendon Reflexes: Reflexes normal.  Psychiatric:        Attention and Perception: Attention and perception normal.        Mood and Affect: Mood is depressed. Affect is tearful.        Speech: Speech normal.        Behavior: Behavior normal. Behavior is cooperative.        Thought Content: Thought content normal.        Cognition and Memory: Cognition and memory normal.        Judgment: Judgment normal.     Results for orders placed or performed in visit on 09/15/19  [facility] - Urine culture   Specimen: Urine  Result Value Ref Range   MICRO NUMBER: [id number]    SPECIMEN QUALITY: Adequate    Sample Source NOT GIVEN    STATUS: FINAL    ISOLATE 1: Coagulase negative staphylococcus, not S. (A)   POCT Urinalysis Dipstick  Result Value Ref Range   Color, UA yellow    Clarity, UA clear    Glucose, UA Negative Negative   Bilirubin, UA negative    Ketones, UA negative    Spec Grav, UA 1.010 1.010 - 1.025   Blood, UA negative    pH, UA 7.0 5.0 - 8.0   Protein, UA Negative Negative   Urobilinogen, UA 0.2 0.2 or 1.0 E.U./dL   Nitrite, UA negative    Leukocytes, UA Trace (A) Negative   Appearance     Odor        Assessment & Plan:   Problem List Items Addressed This Visit      Other   Moderate recurrent major depression (HCC)    Recurrent major depressive disorder.  Currently uncontrolled and unstable.  Requesting medications for treatment.  Has previously taken sertraline, fluoxetine, and effexor with two medications causing side effects.  Chart review completed with patient and sertraline had caused headaches, effexor had caused increased sleepiness and fluoxetine  had originally given her a rash but then found out it was due to an environmental concern and tolerated this medication for a few months.  Is open to beginning fluoxetine 30m daily over the next month.      Relevant Medications   FLUoxetine (PROZAC) 20 MG tablet   Other Relevant Orders   Ambulatory referral to Social Work   Fatigue    C/O fatigue that is long standing without change.  Reports history of snoring and waking up feeling unrested daily.  Will order sleep study to look at OSA, as well as labs (CBC, CMP, Thyroid, Vit D, A1C) for further evaluation.  Plan: 1) Schedule home sleep study and once completed we will contact you with the results 2) Will contact you once the labs have resulted so we can review and update your treatment plan      Relevant Orders   CBC with Differential   Comprehensive Metabolic Panel (CMET)   Thyroid Panel With TSH   HgB A1c   C-reactive protein   Sed Rate (ESR)   B. burgdorfi antibodies   Ehrlichia Antibody Panel   Anaplasma Phagocytophila IgG/IgM Ab   Antinuclear Antib (ANA)   Babesia microti Antibodies IgG, IgM   Home sleep test   Ambulatory referral to Social Work   Joint pain    Generalized joint pain x 2 + years.  Has a history of Osteoarthritis in the hands but this is effecting large joints throughout body.  [place]  include tick borne vector labs per patient request.  Labs ordered (ANA, CRP, ESR, Lyme, Babesia, Anaplasma, Ehrlichia)  Plan: 1) Have labs completed and we will contact you with the results 2) Can use Ibuprofen 600-819m every 6-8 hours as needed for joint pain, according to packaging directions. 3) We will plan to see you back in clinic in 4 weeks for re-evaluation      Relevant Orders   CBC with Differential   Comprehensive Metabolic Panel (CMET)   C-reactive protein   Sed Rate (ESR)   B. burgdorfi antibodies   Ehrlichia Antibody Panel   Anaplasma  Phagocytophila IgG/IgM Ab   Antinuclear Antib (ANA)   Babesia microti Antibodies IgG, IgM   Ambulatory referral to Social Work    Other Visit Diagnoses    Tick bite, sequela    -  Primary   Relevant Orders   B. burgdorfi antibodies   Ehrlichia Antibody Panel   Anaplasma Phagocytophila IgG/IgM Ab   Babesia microti Antibodies IgG, IgM   Snoring       Relevant Orders   Home sleep test   Ambulatory referral to Social Work   BMI 27.0-27.9,adult       Relevant Orders   Lipid Profile   Hearing loss of right ear, unspecified hearing loss type       Relevant Orders   Ambulatory referral to ENT   Ambulatory referral to Social Work   Moderate episode of recurrent major depressive disorder (HCC)       Relevant Medications   FLUoxetine (PROZAC) 20 MG tablet      Meds ordered this encounter  Medications  . FLUoxetine (PROZAC) 20 MG tablet    Sig: Take 1 tablet (20 mg total) by mouth daily.    Dispense:  30 tablet    Refill:  3      Follow up plan: Return in about 4 weeks (around 12/12/2019) for F/U on Fatigue, Depression & Joint Pain.   NHarlin Rain FGreen LevelFamily Nurse Practitioner [facility] 11/14/2019, 2:49 PM

## 2019-11-14 NOTE — Patient Instructions (Addendum)
As we discussed, have your labs completed and I will be in contact with the results.  I have put in a referral for ENT for the right ear hearing loss and a referral for a home sleep study.  They will be contacting you to schedule.  I will be reviewing your charts from 2016-2017 to find the antidepressant that you tolerated the best, will check prices with [facility] locally and contact you with where I have sent it in.  To begin taking that as directed on received.  You will receive a survey after today's visit either digitally by e-mail or paper by [employer]. Your experiences and feedback matter to [place].  Please respond so we know how we are doing as we provide care for you.  Call us with any questions/concerns/needs.  It is my goal to be available to you for your health concerns.  Thanks for choosing me to be a partner in your healthcare needs!  Charlaine Dalton, FNP-C Family Nurse Practitioner [facility] Phone: [phone]

## 2019-11-14 NOTE — Assessment & Plan Note (Signed)
C/O fatigue that is long standing without change.  Reports history of snoring and waking up feeling unrested daily.  Will order sleep study to look at OSA, as well as labs (CBC, CMP, Thyroid, Vit D, A1C) for further evaluation.  Plan: 1) Schedule home sleep study and once completed we will contact you with the results 2) Will contact you once the labs have resulted so we can review and update your treatment plan

## 2019-11-17 ENCOUNTER — Other Ambulatory Visit: Payer: Self-pay

## 2019-11-24 ENCOUNTER — Telehealth: Payer: Self-pay | Admitting: Family Medicine

## 2019-11-24 NOTE — Telephone Encounter (Signed)
Pt is requesting a call back. 

## 2019-11-24 NOTE — Telephone Encounter (Signed)
The pt called complaining of Paresthesia on the left side of her face on Wednesday and Thursday lasting only seconds, but happen a few times. She denies any other symptoms.  She admits that she have not had any numbness today, but wanted to notify us to make sure it was not a side effect to her Prozac.   I spoke with Joni Reining she verbalize that this is a normal reaction to a new start with SSRI medication As long as it only lasting a few seconds and not coming frequent that she would not be concern. I notified her that if her symptoms worsen or doesn't improve to get back in contact with [place]. The pt verbalize understanding, no questions or concerns.

## 2019-12-06 ENCOUNTER — Other Ambulatory Visit: Payer: Self-pay | Admitting: Family Medicine

## 2019-12-06 ENCOUNTER — Ambulatory Visit: Payer: Self-pay | Admitting: Pharmacist

## 2019-12-06 DIAGNOSIS — J4530 Mild persistent asthma, uncomplicated: Secondary | ICD-10-CM

## 2019-12-06 DIAGNOSIS — J4540 Moderate persistent asthma, uncomplicated: Secondary | ICD-10-CM

## 2019-12-06 MED ORDER — ALBUTEROL SULFATE HFA 108 (90 BASE) MCG/ACT IN AERS: 2.0000 | INHALATION_SPRAY | Freq: Four times a day (QID) | RESPIRATORY_TRACT | 1 refills | Status: DC | PRN

## 2019-12-06 NOTE — Chronic Care Management (AMB) (Signed)
Care Management   Initial Visit Note  12/06/2019 Name: Karen Martinez MRN: [id number] DOB: Mar 15, 1966  Subjective:   Objective:  Assessment: Karen Martinez is a 54 y.o. year old female who sees Malfi, Jodelle Gross, FNP for primary care. The care management team was consulted for assistance with care management and care coordination needs related to Medication Assistance .   Outreach to [name] by phone today.  Review of patient status, including review of consultants reports, relevant laboratory and other test results, and collaboration with appropriate care team members and the patient's provider was performed as part of comprehensive patient evaluation and provision of care management services.    SDOH (Social Determinants of Health) assessments performed: No See Care Plan activities for detailed interventions related to [facility])     Outpatient Encounter Medications as of 12/06/2019  Medication Sig  . albuterol (PROVENTIL HFA;VENTOLIN HFA) 108 (90 Base) MCG/ACT inhaler Inhale 2 puffs into the lungs every 6 (six) hours as needed for wheezing or shortness of breath.  . calcium-vitamin D (OSCAL WITH D) 250-125 MG-UNIT tablet Take 1 tablet by mouth daily.  Marland Kitchen FLUoxetine (PROZAC) 20 MG tablet Take 1 tablet (20 mg total) by mouth daily.  Marland Kitchen loratadine (CLARITIN) 10 MG tablet Take 10 mg by mouth daily.  . Magnesium Citrate 100 MG TABS Take 1 tablet by mouth 2 (two) times daily.  . montelukast (SINGULAIR) 10 MG tablet Take 1 tablet (10 mg total) by mouth at bedtime. (Patient not taking: Reported on 11/14/2019)  . Multiple Vitamins-Minerals (ONE-A-DAY 50 PLUS PO) Take by mouth.  . Omega-3 Fatty Acids (FISH OIL) 1000 MG CAPS Take by mouth.  . triamcinolone (NASACORT AQ) 55 MCG/ACT AERO nasal inhaler Place 2 sprays into the nose daily.   No facility-administered encounter medications on file as of 12/06/2019.    Goals Addressed            This Visit's Progress   . PharmD - medication assistance        CARE PLAN ENTRY (see longtitudinal plan of care for additional care plan information)   Current Barriers:  . Chronic Disease Management support, education, and care coordination needs related to Depression and Asthma . Financial Barriers - high deductible commercial insurance through [facility]  Pharmacist Clinical Goal(s):  Marland Kitchen Over the next 30 days, patient will work with CM Pharmacist to address needs related to medication assistance and complete medication review  Interventions: . Schedule appointment to complete medication review . Counsel on options to reduce cost of medications o Reports recently enrolled in high deductible commercial insurance plan with [employer] o Discuss cost savings options with local pharmacies using discount drug lists and discount cards . Counsel on asthma management and use of albuterol inhaler o Reports asthma control improved since identification as cat as allergy trigger o Reports currently using albuterol inhaler ~ once/week o Inquire about expiration date of current inhaler - patient does not currently have inhaler with her, but denies having refilled in years - Note current Rx expired - will ask PCP to send refill of albuterol to pharmacy for patient . Counsel patient on administration of inhaler including instruction for cleaning and priming if not used for > 2 weeks . Counsel on COVID-19 virus o Reports received 2nd dose of Pfizer COVID-19 vaccine on 4/2  Patient Self Care Activities:  . Attends all scheduled provider appointments . Calls pharmacy for medication refills . Calls provider office for new concerns or questions  Initial goal documentation         Follow up plan:  Telephone follow up appointment with care management team member scheduled for: 4/23 at 10 am  Ms. [name] was given information about Care Management services today including:  1. Care Management services include personalized support from designated  clinical staff supervised by a physician, including individualized plan of care and coordination with other care providers 2. 24/7 contact phone numbers for assistance for urgent and routine care needs. 3. The patient may stop Care Management services at any time (effective at the end of the month) by phone call to the office staff.  Patient agreed to services and verbal consent obtained.  Harlow Asa, PharmD, [place] [facility] [phone]

## 2019-12-06 NOTE — Progress Notes (Signed)
New albuterol Rx requested by CCM Pharmacist.  Rx sent to [facility] on file.

## 2019-12-06 NOTE — Patient Instructions (Signed)
Thank you allowing the Care Management Team to be a part of your care! It was a pleasure speaking with you today!     Care Management Team    Alto Denver RN, MSN, CCM Nurse Care Coordinator  [phone]   Duanne Moron PharmD  Clinical Pharmacist  [phone]   Dickie La LCSW Clinical Social Worker [phone]   Visit Information  Goals Addressed            This Visit's Progress   . PharmD - medication assistance       CARE PLAN ENTRY (see longtitudinal plan of care for additional care plan information)   Current Barriers:  . Chronic Disease Management support, education, and care coordination needs related to Depression and Asthma . Financial Barriers - high deductible commercial insurance through [facility]  Pharmacist Clinical Goal(s):  Marland Kitchen Over the next 30 days, patient will work with CM Pharmacist to address needs related to medication assistance and complete medication review  Interventions: . Schedule appointment to complete medication review . Counsel on options to reduce cost of medications o Reports recently enrolled in high deductible commercial insurance plan with [employer] [facility] o Discuss cost savings options with local pharmacies using discount drug lists and discount cards . Counsel on asthma management and use of albuterol inhaler o Reports asthma control improved since identification as cat as allergy trigger o Reports currently using albuterol inhaler ~ once/week o Inquire about expiration date of current inhaler - patient does not currently have inhaler with her, but denies having refilled in years - Note current Rx expired - will ask PCP to send refill of albuterol to pharmacy for patient . Counsel patient on administration of inhaler including instruction for cleaning and priming if not used for > 2 weeks . Counsel on COVID-19 virus o Reports received 2nd dose of Pfizer COVID-19 vaccine on 4/2  Patient Self Care Activities:   . Attends all scheduled provider appointments . Calls pharmacy for medication refills . Calls provider office for new concerns or questions   Initial goal documentation        Ms. Griffith was given information about Care Management services today including:  1. Care Management services include personalized support from designated clinical staff supervised by her physician, including individualized plan of care and coordination with other care providers 2. 24/7 contact phone numbers for assistance for urgent and routine care needs. 3. The patient may stop CCM services at any time (effective at the end of the month) by phone call to the office staff.  Patient agreed to services and verbal consent obtained.   Patient verbalizes understanding of instructions provided today.   Telephone follow up appointment with care management team member scheduled for: 4/23 at 10 am  Duanne Moron, PharmD, [facility] Clinical Pharmacist [facility] [employer] [phone]

## 2019-12-14 ENCOUNTER — Ambulatory Visit: Payer: Self-pay | Admitting: Licensed Clinical Social Worker

## 2019-12-14 ENCOUNTER — Ambulatory Visit (INDEPENDENT_AMBULATORY_CARE_PROVIDER_SITE_OTHER): Payer: Self-pay | Admitting: Family Medicine

## 2019-12-14 ENCOUNTER — Other Ambulatory Visit: Payer: Self-pay

## 2019-12-14 ENCOUNTER — Encounter: Payer: Self-pay | Admitting: Family Medicine

## 2019-12-14 VITALS — BP 130/69 | HR 62 | Temp 97.7°F | Ht 63.0 in | Wt 154.4 lb

## 2019-12-14 DIAGNOSIS — M545 Low back pain, unspecified: Secondary | ICD-10-CM

## 2019-12-14 DIAGNOSIS — M5410 Radiculopathy, site unspecified: Secondary | ICD-10-CM | POA: Insufficient documentation

## 2019-12-14 MED ORDER — PREDNISONE 20 MG PO TABS: 20.0000 mg | ORAL_TABLET | Freq: Two times a day (BID) | ORAL | 0 refills | Status: AC

## 2019-12-14 MED ORDER — DICLOFENAC SODIUM 1 % EX GEL: 2.0000 g | Freq: Four times a day (QID) | CUTANEOUS | 1 refills | Status: DC

## 2019-12-14 MED ORDER — PREDNISONE 20 MG PO TABS: 20.0000 mg | ORAL_TABLET | Freq: Two times a day (BID) | ORAL | 0 refills | Status: DC

## 2019-12-14 NOTE — Chronic Care Management (AMB) (Signed)
  Care Management   Follow Up Note   12/14/2019 Name: Karen Martinez MRN: [id number] DOB: June 20, 1966  Referred by: Tarri Fuller, FNP Reason for referral : Care Coordination   Karen Martinez is a 54 y.o. year old female who is a primary care patient of Anitra Lauth, Jodelle Gross, FNP. The care management team was consulted for assistance with care management and care coordination needs.    Review of patient status, including review of consultants reports, relevant laboratory and other test results, and collaboration with appropriate care team members and the patient's provider was performed as part of comprehensive patient evaluation and provision of chronic care management services.    SDOH (Social Determinants of Health) assessments performed: Yes See Care Plan activities for detailed interventions related to SDOH)   LCSW completed initial outreach for community resource support and was able to reach patient successfully. Patient answered and provided HIPPA verifications. Patient reports no longer needing social work assistance as she was able to find health insurance through [facility] which will start on 12/30/19. Patient reports that she will even have dental insurance. Patient was appreciative of call and is agreeable to contact CCM LCSW back if future social work needs arise.  The patient has been provided with contact information for the care management team and has been advised to call with any health related questions or concerns.   Dickie La, BSW, MSW, LCSW [facility]  [facility] [email or web address] Phone: [phone]

## 2019-12-14 NOTE — Progress Notes (Signed)
Subjective:    Patient ID: Karen Martinez, female    DOB: [birth year], 54 y.o.   MRN: [id number]  Karen Martinez is a 54 y.o. female presenting on 12/14/2019 for Back Pain (lower back pain that she think is related to a old injury that flares up. The pain radiates down the Rt buttucks and the Rt upper leg to the knee. She describe it as a burning sensation.)   HPI  [name] presents to clinic for evaluation of low back pain with right sided radiculopathy through her right knee.  Has had this in the past but has typically resolved with chiropractor treatments.  Denies changes in bowel/bladder function or saddle anesthesia.  Reports has had a back injury > 10 years ago and she has followed with a chiropractor for right hip pain in the past.  Reports over the past few days she has had an increase in her symptoms, which she is unsure if this is from sitting in camping chairs or sleeping incorrectly.  Has been having difficulty with switching positions, going from sitting to standing.  Has not found anything that has given her relief of her symptoms.  Reports has new insurance beginning 12/30/2019 and would like to discuss referral to Orthopedics after her new insurance start date.   Depression screen [facility] 2/9 11/14/2019 10/21/2015 09/17/2015  Decreased Interest 3 1 0  Down, Depressed, Hopeless 2 1 0  PHQ - 2 Score 5 2 0  Altered sleeping 2 2 1   Tired, decreased energy 3 2 1   Change in appetite 0 0 1  Feeling bad or failure about yourself  3 1 1   Trouble concentrating 0 0 1  Moving slowly or fidgety/restless 1 0 0  Suicidal thoughts 2 0 0  PHQ-9 Score 16 7 5   Difficult doing work/chores Very difficult - Somewhat difficult    Social History   Tobacco Use  . Smoking status: Never Smoker  . Smokeless tobacco: Never Used  Substance Use Topics  . Alcohol use: Yes    Alcohol/week: 5.0 standard drinks    Types: 5 Glasses of wine per week    Comment: 5 glasses wine a week   . Drug use: No     Review of Systems  Constitutional: Negative.   HENT: Negative.   Eyes: Negative.   Respiratory: Negative.   Cardiovascular: Negative.   Gastrointestinal: Negative.   Endocrine: Negative.   Genitourinary: Negative.   Musculoskeletal: Positive for back pain. Negative for arthralgias, gait problem, joint swelling, myalgias, neck pain and neck stiffness.  Skin: Negative.   Allergic/Immunologic: Negative.   Neurological: Negative.   Hematological: Negative.   Psychiatric/Behavioral: Negative.    Per HPI unless specifically indicated above     Objective:    BP 130/69 (BP Location: Left Arm, Patient Position: Sitting, Cuff Size: Normal)   Pulse 62   Temp 97.7 F (36.5 C) (Temporal)   Ht 5\' 3"  (1.6 m)   Wt 154 lb 6.4 oz (70 kg)   BMI 27.35 kg/m   Wt Readings from Last 3 Encounters:  12/14/19 154 lb 6.4 oz (70 kg)  11/14/19 157 lb 6.4 oz (71.4 kg)  09/15/19 155 lb (70.3 kg)    Physical Exam Vitals reviewed.  Constitutional:      General: She is not in acute distress.    Appearance: Normal appearance. She is well-developed, well-groomed and overweight. She is not ill-appearing or toxic-appearing.  HENT:     Head: Normocephalic.  Eyes:  General: Lids are normal. Vision grossly intact.        Right eye: No discharge.        Left eye: No discharge.     Extraocular Movements: Extraocular movements intact.     Conjunctiva/sclera: Conjunctivae normal.     Pupils: Pupils are equal, round, and reactive to light.  Cardiovascular:     Rate and Rhythm: Normal rate and regular rhythm.     Pulses: Normal pulses.          Dorsalis pedis pulses are 2+ on the right side and 2+ on the left side.       Posterior tibial pulses are 2+ on the right side and 2+ on the left side.     Heart sounds: Normal heart sounds. No murmur. No friction rub. No gallop.   Pulmonary:     Effort: Pulmonary effort is normal. No respiratory distress.     Breath sounds: Normal breath sounds.   Abdominal:     General: Abdomen is flat. Bowel sounds are normal. There is no distension.     Palpations: Abdomen is soft.  Musculoskeletal:     Cervical back: Normal.     Thoracic back: Normal.     Lumbar back: Spasms and tenderness present. No swelling, edema, deformity, signs of trauma, lacerations or bony tenderness. Decreased range of motion. Positive right straight leg raise test and positive left straight leg raise test. No scoliosis.     Right lower leg: No edema.     Left lower leg: No edema.  Feet:     Right foot:     Skin integrity: Skin integrity normal.     Left foot:     Skin integrity: Skin integrity normal.  Skin:    General: Skin is warm and dry.     Capillary Refill: Capillary refill takes less than 2 seconds.  Neurological:     General: No focal deficit present.     Mental Status: She is alert and oriented to person, place, and time.     Cranial Nerves: No cranial nerve deficit.     Sensory: No sensory deficit.     Motor: No weakness.     Coordination: Coordination normal.     Gait: Gait normal.  Psychiatric:        Attention and Perception: Attention and perception normal.        Mood and Affect: Mood and affect normal.        Speech: Speech normal.        Behavior: Behavior normal. Behavior is cooperative.        Thought Content: Thought content normal.        Cognition and Memory: Cognition and memory normal.        Judgment: Judgment normal.     Results for orders placed or performed in visit on 09/15/19  [facility] - Urine culture   Specimen: Urine  Result Value Ref Range   MICRO NUMBER: [id number]    SPECIMEN QUALITY: Adequate    Sample Source NOT GIVEN    STATUS: FINAL    ISOLATE 1: Coagulase negative staphylococcus, not S. (A)   POCT Urinalysis Dipstick  Result Value Ref Range   Color, UA yellow    Clarity, UA clear    Glucose, UA Negative Negative   Bilirubin, UA negative    Ketones, UA negative    Spec Grav, UA 1.010 1.010 - 1.025   Blood,  UA negative    pH, UA 7.0 5.0 -  8.0   Protein, UA Negative Negative   Urobilinogen, UA 0.2 0.2 or 1.0 E.U./dL   Nitrite, UA negative    Leukocytes, UA Trace (A) Negative   Appearance     Odor        Assessment & Plan:   Problem List Items Addressed This Visit      Nervous and Auditory   Radiculopathy     Other   Low back pain potentially associated with radiculopathy    Bilateral low back pain with right sided radiculopathy.  Will treat with prednisone 40mg  daily x 5 days.  Advised to avoid all NSAIDs while taking steroids to prevent GI upset.  Discussed topical diclofenac gel that she can use on her lower back as needed.  Plan: 1. Will treat with prednisone 40mg  daily x 5 days 2. Will work on home exercises with print out given and discussed options of looking up on YouTube for low back stretches 3. Will follow up in 2-3 weeks for check on progress and if needed will refer to Orthopedics as injury is >55 years old and patient agreeable to referral now that she has new insurance.      Relevant Medications   acetaminophen (TYLENOL) 500 MG tablet   naproxen (NAPROSYN) 500 MG tablet   predniSONE (DELTASONE) 20 MG tablet    Other Visit Diagnoses    Back pain with right-sided radiculopathy    -  Primary   Relevant Medications   acetaminophen (TYLENOL) 500 MG tablet   naproxen (NAPROSYN) 500 MG tablet   diclofenac Sodium (VOLTAREN) 1 % GEL   predniSONE (DELTASONE) 20 MG tablet      Meds ordered this encounter  Medications  . DISCONTD: predniSONE (DELTASONE) 20 MG tablet    Sig: Take 1 tablet (20 mg total) by mouth 2 (two) times daily with a meal for 5 days.    Dispense:  10 tablet    Refill:  0  . DISCONTD: diclofenac Sodium (VOLTAREN) 1 % GEL    Sig: Apply 2 g topically 4 (four) times daily.    Dispense:  150 g    Refill:  1  . diclofenac Sodium (VOLTAREN) 1 % GEL    Sig: Apply 2 g topically 4 (four) times daily.    Dispense:  150 g    Refill:  1  . predniSONE  (DELTASONE) 20 MG tablet    Sig: Take 1 tablet (20 mg total) by mouth 2 (two) times daily with a meal for 5 days.    Dispense:  10 tablet    Refill:  0      Follow up plan: Return in about 3 weeks (around 01/04/2020) for Right sided back pain f/u.   Harlin Rain, [place] [facility] Nurse Practitioner [facility] 12/14/2019, 9:34 AM

## 2019-12-14 NOTE — Assessment & Plan Note (Signed)
Bilateral low back pain with right sided radiculopathy.  Will treat with prednisone 40mg  daily x 5 days.  Advised to avoid all NSAIDs while taking steroids to prevent GI upset.  Discussed topical diclofenac gel that she can use on her lower back as needed.  Plan: 1. Will treat with prednisone 40mg  daily x 5 days 2. Will work on home exercises with print out given and discussed options of looking up on YouTube for low back stretches 3. Will follow up in 2-3 weeks for check on progress and if needed will refer to Orthopedics as injury is >52 years old and patient agreeable to referral now that she has new insurance.

## 2019-12-14 NOTE — Patient Instructions (Addendum)
Take medications as directed.  We will plan to follow up in 3 weeks  Call sooner if having any worsening of symptoms             Low Back Pain Exercises  See other page with pictures of each exercise.  Start with 1 or 2 of these exercises that you are most comfortable with. Do not do any exercises that cause you significant worsening pain. Some of these may cause some "stretching soreness" but it should go away after you stop the exercise, and get better over time. Gradually increase up to 3-4 exercises as tolerated.  Standing hamstring stretch: Place the heel of your leg on a stool about 15 inches high. Keep your knee straight. Lean forward, bending at the hips until you feel a mild stretch in the back of your thigh. Make sure you do not roll your shoulders and bend at the waist when doing this or you will stretch your lower back instead. Hold the stretch for 15 to 30 seconds. Repeat 3 times. Repeat the same stretch on your other leg.  Cat and camel: Get down on your hands and knees. Let your stomach sag, allowing your back to curve downward. Hold this position for 5 seconds. Then arch your back and hold for 5 seconds. Do 3 sets of 10.  Quadriped Arm/Leg Raises: Get down on your hands and knees. Tighten your abdominal muscles to stiffen your spine. While keeping your abdominals tight, raise one arm and the opposite leg away from you. Hold this position for 5 seconds. Lower your arm and leg slowly and alternate sides. Do this 10 times on each side.  Pelvic tilt: Lie on your back with your knees bent and your feet flat on the floor. Tighten your abdominal muscles and push your lower back into the floor. Hold this position for 5 seconds, then relax. Do 3 sets of 10.  Partial curl: Lie on your back with your knees bent and your feet flat on the floor. Tighten your stomach muscles and flatten your back against the floor. Tuck your chin to your chest. With your hands stretched out in front  of you, curl your upper body forward until your shoulders clear the floor. Hold this position for 3 seconds. Don't hold your breath. It helps to breathe out as you lift your shoulders up. Relax. Repeat 10 times. Build to 3 sets of 10. To challenge yourself, clasp your hands behind your head and keep your elbows out to the side.  Lower trunk rotation: Lie on your back with your knees bent and your feet flat on the floor. Tighten your abdominal muscles and push your lower back into the floor. Keeping your shoulders down flat, gently rotate your legs to one side, then the other as far as you can. Repeat 10 to 20 times.  Single knee to chest stretch: Lie on your back with your legs straight out in front of you. Bring one knee up to your chest and grasp the back of your thigh. Pull your knee toward your chest, stretching your buttock muscle. Hold this position for 15 to 30 seconds and return to the starting position. Repeat 3 times on each side.  Double knee to chest: Lie on your back with your knees bent and your feet flat on the floor. Tighten your abdominal muscles and push your lower back into the floor. Pull both knees up to your chest. Hold for 5 seconds and repeat 10 to 20 times.  Warning symptoms of possible EMERGENCY SPINAL CORD COMPRESSION (also called CAUDA EQUINA SYNDROME) - Leg or muscle weakness, difficulty lifting or heavy muscles that aren't working (not talking about pain or numbness) - Numbness in your groin or saddle region - Unable to control your bowel or bladder with incontinence IF you get any of these symptoms this potentially could be a serious spinal cord injury and recommend that you go directly to the Hospital Emergency Dept  We will plan to see you back in 3 weeks for follow up on your lower back pain with right sided symptoms  You will receive a survey after today's visit either digitally by e-mail or paper by [place] mail. Your experiences and feedback matter to [place].  Please  respond so we know how we are doing as we provide care for you.  Call us with any questions/concerns/needs.  It is my goal to be available to you for your health concerns.  Thanks for choosing me to be a partner in your healthcare needs!  Harlin Rain, FNP-C Family Nurse Practitioner [facility] Phone: [phone]

## 2019-12-20 ENCOUNTER — Telehealth: Payer: Self-pay | Admitting: Family Medicine

## 2019-12-20 NOTE — Telephone Encounter (Signed)
   SF 12/20/2019   Name: Karen Martinez   MRN: [id number]   DOB: Jul 06, 1966   AGE: 54 y.o.   GENDER: female   PCP Malfi, Jodelle Gross, FNP.   Called pt regarding Administrator, arts for [employer]. Patient stated that she was able to find health and dental insurance through [facility]. She is also receiving assistance with her medications.  Asked patient if she had any additional needs at this time. Patient stated that she has no additional needs.   Closing referral pending any other needs of patient.    [facility] Care Guide, Embedded Care Coordination [facility], Care Management Phone: [phone] Email: [email or web address]

## 2019-12-22 ENCOUNTER — Ambulatory Visit: Payer: Self-pay | Admitting: Pharmacist

## 2019-12-22 ENCOUNTER — Telehealth: Payer: Self-pay

## 2019-12-22 DIAGNOSIS — J4530 Mild persistent asthma, uncomplicated: Secondary | ICD-10-CM

## 2019-12-22 DIAGNOSIS — F331 Major depressive disorder, recurrent, moderate: Secondary | ICD-10-CM

## 2019-12-22 NOTE — Patient Instructions (Addendum)
Bone Health Bones protect organs, store calcium, anchor muscles, and support the whole body. Keeping your bones strong is important, especially as you get older. You can take actions to help keep your bones strong and healthy. Why is keeping my bones healthy important?  Keeping your bones healthy is important because your body constantly replaces bone cells. Cells get old, and new cells take their place. As we age, we lose bone cells because the body may not be able to make enough new cells to replace the old cells. The amount of bone cells and bone tissue you have is referred to as bone mass. The higher your bone mass, the stronger your bones. The aging process leads to an overall loss of bone mass in the body, which can increase the likelihood of:  Joint pain and stiffness.  Broken bones.  A condition in which the bones become weak and brittle (osteoporosis). A large decline in bone mass occurs in older adults. In women, it occurs about the time of menopause. What actions can I take to keep my bones healthy? Good health habits are important for maintaining healthy bones. This includes eating nutritious foods and exercising regularly. To have healthy bones, you need to get enough of the right minerals and vitamins. Most nutrition experts recommend getting these nutrients from the foods that you eat. In some cases, taking supplements may also be recommended. Doing certain types of exercise is also important for bone health. What are the nutritional recommendations for healthy bones?  Eating a well-balanced diet with plenty of calcium and vitamin D will help to protect your bones. Nutritional recommendations vary from person to person. Ask your health care provider what is healthy for you. Here are some general guidelines. Get enough calcium Calcium is the most important (essential) mineral for bone health. Most people can get enough calcium from their diet, but supplements may be recommended for  people who are at risk for osteoporosis. Good sources of calcium include:  Dairy products, such as low-fat or nonfat milk, cheese, and yogurt.  Dark green leafy vegetables, such as bok choy and broccoli.  Calcium-fortified foods, such as orange juice, cereal, bread, soy beverages, and tofu products.  Nuts, such as almonds. Follow these recommended amounts for daily calcium intake:  Children, age 1-3: 700 mg.  Children, age 4-8: 1,000 mg.  Children, age 9-13: 1,300 mg.  Teens, age 14-18: 1,300 mg.  Adults, age 19-50: 1,000 mg.  Adults, age 51-70: ? Men: 1,000 mg. ? Women: 1,200 mg.  Adults, age 71 or older: 1,200 mg.  Pregnant and breastfeeding females: ? Teens: 1,300 mg. ? Adults: 1,000 mg. Get enough vitamin D Vitamin D is the most essential vitamin for bone health. It helps the body absorb calcium. Sunlight stimulates the skin to make vitamin D, so be sure to get enough sunlight. If you live in a cold climate or you do not get outside often, your health care provider may recommend that you take vitamin D supplements. Good sources of vitamin D in your diet include:  Egg yolks.  Saltwater fish.  Milk and cereal fortified with vitamin D. Follow these recommended amounts for daily vitamin D intake:  Children and teens, age 1-18: 600 international units.  Adults, age 50 or younger: 400-800 international units.  Adults, age 51 or older: 800-1,000 international units. Get other important nutrients Other nutrients that are important for bone health include:  Phosphorus. This mineral is found in meat, poultry, dairy foods, nuts, and legumes. The   recommended daily intake for adult men and adult women is 700 mg.  Magnesium. This mineral is found in seeds, nuts, dark green vegetables, and legumes. The recommended daily intake for adult men is 400-420 mg. For adult women, it is 310-320 mg.  Vitamin K. This vitamin is found in green leafy vegetables. The recommended daily  intake is 120 mg for adult men and 90 mg for adult women. What type of physical activity is best for building and maintaining healthy bones? Weight-bearing and strength-building activities are important for building and maintaining healthy bones. Weight-bearing activities cause muscles and bones to work against gravity. Strength-building activities increase the strength of the muscles that support bones. Weight-bearing and muscle-building activities include:  Walking and hiking.  Jogging and running.  Dancing.  Gym exercises.  Lifting weights.  Tennis and racquetball.  Climbing stairs.  Aerobics. Adults should get at least 30 minutes of moderate physical activity on most days. Children should get at least 60 minutes of moderate physical activity on most days. Ask your health care provider what type of exercise is best for you. How can I find out if my bone mass is low? Bone mass can be measured with an X-ray test called a bone mineral density (BMD) test. This test is recommended for all women who are age 30 or older. It may also be recommended for:  Men who are age 68 or older.  People who are at risk for osteoporosis because of: ? Having bones that break easily. ? Having a long-term disease that weakens bones, such as kidney disease or rheumatoid arthritis. ? Having menopause earlier than normal. ? Taking medicine that weakens bones, such as steroids, thyroid hormones, or hormone treatment for breast cancer or prostate cancer. ? Smoking. ? Drinking three or more alcoholic drinks a day. If you find that you have a low bone mass, you may be able to prevent osteoporosis or further bone loss by changing your diet and lifestyle. Where can I find more information? For more information, check out the following websites:  National Osteoporosis Foundation: https://carlson-fletcher.info/  [employer] of [employer]: www.bones.http://www.myers.net/  International Osteoporosis Foundation:  Investment banker, operational.iofbonehealth.org Summary  The aging process leads to an overall loss of bone mass in the body, which can increase the likelihood of broken bones and osteoporosis.  Eating a well-balanced diet with plenty of calcium and vitamin D will help to protect your bones.  Weight-bearing and strength-building activities are also important for building and maintaining strong bones.  Bone mass can be measured with an X-ray test called a bone mineral density (BMD) test. This information is not intended to replace advice given to you by your health care provider. Make sure you discuss any questions you have with your health care provider. Document Revised: 09/13/2017 Document Reviewed: 09/13/2017 Elsevier Patient Education  2020 [employer]. Thank you allowing the Chronic Care Management Team to be a part of your care! It was a pleasure speaking with you today!     CCM (Chronic Care Management) Team    Alto Denver RN, MSN, CCM Nurse Care Coordinator  [phone]   Duanne Moron PharmD  Clinical Pharmacist  [phone]   Dickie La LCSW Clinical Social Worker [phone]  Visit Information  Goals Addressed            This Visit's Progress   . PharmD - medication assistance       CARE PLAN ENTRY (see longtitudinal plan of care for additional care plan information)   Current  Barriers:  . Chronic Disease Management support, education, and care coordination needs related to Depression and Asthma  Pharmacist Clinical Goal(s):  Marland Kitchen Over the next 30 days, patient will work with CM Pharmacist to address needs related to medication assistance and complete medication review  Interventions: . Reports medication financial barriers resolved with new commercial insurance through [facility] o Reports signed up for a new plan that starts 12/30/19 and does not have a high deductible . Comprehensive medication review performed; medication list updated in electronic medical  record o Reports had Vitamin D deficiency in the past. From chart review, see level last checked in 05/2016.  - Reports has not been taking Vitamin D supplement recently - Patient intersted in having Vitamin D level rechecked.  o Reports has not recently been taking any of previous vitamins or mineral supplments.  - Note patient planning to come in for lab work as scheduled by PCP. Marland Kitchen Reports anxiety and depression symptoms well controlled since started on fluoxetine by PCP. Reports taking at night to reduce sedation, but reports continues to have daytime sedation and occasional dizziness.  o Patient asks about reducing dose of fluoxetine to see if side effects improve. Advise will collaborate with PCP . Follow up regarding asthma management and use of albuterol inhaler o Reports symptoms continue to be well controlled with less exposure to cat (cat not sleeping in bedroom) o Denies having picked up new inhaler from pharmacy, but remind and plans to pick up . Reports nasal symptoms controlled with loratadine and occasional use of Nasacort. . Patient asks about bone health and calcium. Provide verbal and written education. Marland Kitchen Collaborate with PCP regarding day time sedation with fluoxetine. Provider advises "She can certainly reduce to the 10mg . If she notices that she is having less control on her anxiety and depression with the 10mg , she can take the 20mg  but take it closer to dinner time, so it would hopefully provide less sedation the next day." o Follow up with patient to provide this instruction.  o Patient confirms received tablet dosage form from pharmacy/able to split tablets o Patient verbalizes understaning and deines further questions. . Also request follow up Vitamin D lab work for patient  Patient Self Care Activities:  . Attends all scheduled provider appointments . Calls pharmacy for medication refills . Calls provider office for new concerns or questions   Please see past  updates related to this goal by clicking on the "Past Updates" button in the selected goal         Patient verbalizes understanding of instructions provided today.   The care management team will reach out to the patient again over the next 30 days.   Harlow Asa, PharmD, [place] [employer] [phone]

## 2019-12-22 NOTE — Chronic Care Management (AMB) (Signed)
Chronic Care Management   Follow Up Note   12/22/2019 Name: Karen Martinez MRN: [id number] DOB: 09-09-1965  Referred by: Tarri Fuller, FNP Reason for referral : Chronic Care Management (Patient Phone Call)   Karen Martinez is a 54 y.o. year old female who is a primary care patient of Anitra Lauth, Jodelle Gross, FNP. The CCM team was consulted for assistance with chronic disease management and care coordination needs.    I reached out to [place] by phone today to complete medication review.  Review of patient status, including review of consultants reports, relevant laboratory and other test results, and collaboration with appropriate care team members and the patient's provider was performed as part of comprehensive patient evaluation and provision of chronic care management services.     Outpatient Encounter Medications as of 12/22/2019  Medication Sig Note  . acetaminophen (TYLENOL) 500 MG tablet Take 1,000 mg by mouth in the morning and at bedtime.   Marland Kitchen albuterol (VENTOLIN HFA) 108 (90 Base) MCG/ACT inhaler Inhale 2 puffs into the lungs every 6 (six) hours as needed for wheezing or shortness of breath.   Marland Kitchen FLUoxetine (PROZAC) 20 MG tablet Take 1 tablet (20 mg total) by mouth daily. 12/22/2019: Taking at bedtime  . loratadine (CLARITIN) 10 MG tablet Take 10 mg by mouth daily.   . calcium-vitamin D (OSCAL WITH D) 250-125 MG-UNIT tablet Take 1 tablet by mouth daily.   . Magnesium Citrate 100 MG TABS Take 1 tablet by mouth 2 (two) times daily.   . Multiple Vitamins-Minerals (ONE-A-DAY 50 PLUS PO) Take by mouth.   . naproxen (NAPROSYN) 500 MG tablet Take 500 mg by mouth at bedtime as needed.   . Omega-3 Fatty Acids (FISH OIL) 1000 MG CAPS Take by mouth.   . triamcinolone (NASACORT AQ) 55 MCG/ACT AERO nasal inhaler Place 2 sprays into the nose daily.   . [DISCONTINUED] diclofenac Sodium (VOLTAREN) 1 % GEL Apply 2 g topically 4 (four) times daily. (Patient not taking: Reported on 12/22/2019)   .  [DISCONTINUED] montelukast (SINGULAIR) 10 MG tablet Take 1 tablet (10 mg total) by mouth at bedtime. (Patient not taking: Reported on 12/14/2019)    No facility-administered encounter medications on file as of 12/22/2019.    Goals Addressed            This Visit's Progress   . PharmD - medication assistance       CARE PLAN ENTRY (see longtitudinal plan of care for additional care plan information)   Current Barriers:  . Chronic Disease Management support, education, and care coordination needs related to Depression and Asthma  Pharmacist Clinical Goal(s):  Marland Kitchen Over the next 30 days, patient will work with CM Pharmacist to address needs related to medication assistance and complete medication review  Interventions: . Reports medication financial barriers resolved with new commercial insurance through [facility] o Reports signed up for a new plan that starts 12/30/19 and does not have a high deductible . Comprehensive medication review performed; medication list updated in electronic medical record o Reports had Vitamin D deficiency in the past. From chart review, see level last checked in 05/2016.  - Reports has not been taking Vitamin D supplement recently - Patient intersted in having Vitamin D level rechecked.  o Reports has not recently been taking any of previous vitamins or mineral supplments.  - Note patient planning to come in for lab work as scheduled by PCP. Marland Kitchen Reports anxiety and depression symptoms well controlled since  started on fluoxetine by PCP. Reports taking at night to reduce sedation, but reports continues to have daytime sedation and occasional dizziness.  o Patient asks about reducing dose of fluoxetine to see if side effects improve. Advise will collaborate with PCP . Follow up regarding asthma management and use of albuterol inhaler o Reports symptoms continue to be well controlled with less exposure to cat (cat not sleeping in bedroom) o Denies having picked up  new inhaler from pharmacy, but remind and plans to pick up . Reports nasal symptoms controlled with loratadine and occasional use of Nasacort. . Patient asks about bone health and calcium. Provide verbal and written education. Marland Kitchen Collaborate with PCP regarding day time sedation with fluoxetine. Provider advises "She can certainly reduce to the 10mg . If she notices that she is having less control on her anxiety and depression with the 10mg , she can take the 20mg  but take it closer to dinner time, so it would hopefully provide less sedation the next day." o Follow up with patient to provide this instruction.  o Patient confirms received tablet dosage form from pharmacy/able to split tablets o Patient verbalizes understaning and deines further questions. . Also request follow up Vitamin D lab work for patient  Patient Self Care Activities:  . Attends all scheduled provider appointments . Calls pharmacy for medication refills . Calls provider office for new concerns or questions   Please see past updates related to this goal by clicking on the "Past Updates" button in the selected goal         Plan  The care management team will reach out to the patient again over the next 30 days.   Harlow Asa, PharmD, [place] [employer] [phone]

## 2020-01-03 ENCOUNTER — Other Ambulatory Visit: Payer: Self-pay | Admitting: Family Medicine

## 2020-01-03 DIAGNOSIS — E785 Hyperlipidemia, unspecified: Secondary | ICD-10-CM | POA: Insufficient documentation

## 2020-01-03 DIAGNOSIS — E782 Mixed hyperlipidemia: Secondary | ICD-10-CM

## 2020-01-03 MED ORDER — ATORVASTATIN CALCIUM 10 MG PO TABS: 10.0000 mg | ORAL_TABLET | Freq: Every day | ORAL | 1 refills | Status: DC

## 2020-01-03 NOTE — Progress Notes (Signed)
Atorvastatin Rx sent to pharmacy on file

## 2020-01-04 ENCOUNTER — Telehealth: Payer: Self-pay | Admitting: *Deleted

## 2020-01-04 ENCOUNTER — Ambulatory Visit: Payer: Self-pay | Admitting: Family Medicine

## 2020-01-04 NOTE — Telephone Encounter (Signed)
Patient notified of lab results and medication sent to pharmacy- patient is reluctant to start statin without trying alternatives first- she wants to try Cinnamon supplement and see if that helps.She will take medication if she is unable to lower numbers using natural alternatives. Any other suggestions and when should she follow up after trying supplements.

## 2020-01-05 NOTE — Telephone Encounter (Signed)
Can try cinnamon but have good results with "Red Yeast Rice" supplement and Garlic oil supplements.  Can try a mediterranean diet and increase her exercise.  Can try these and we can plan to recheck her labs in 3 months.  Thanks

## 2020-01-05 NOTE — Telephone Encounter (Signed)
Patient notified

## 2020-01-07 LAB — COMPREHENSIVE METABOLIC PANEL
AG Ratio: 1.6 (calc) (ref 1.0–2.5)
ALT: 11 U/L (ref 6–29)
AST: 10 U/L (ref 10–35)
Albumin: 4.3 g/dL (ref 3.6–5.1)
Alkaline phosphatase (APISO): 82 U/L (ref 37–153)
BUN: 8 mg/dL (ref 7–25)
CO2: 26 mmol/L (ref 20–32)
Calcium: 9.7 mg/dL (ref 8.6–10.4)
Chloride: 104 mmol/L (ref 98–110)
Creat: 0.69 mg/dL (ref 0.50–1.05)
Globulin: 2.7 g/dL (calc) (ref 1.9–3.7)
Glucose, Bld: 86 mg/dL (ref 65–99)
Potassium: 4.1 mmol/L (ref 3.5–5.3)
Sodium: 140 mmol/L (ref 135–146)
Total Bilirubin: 0.6 mg/dL (ref 0.2–1.2)
Total Protein: 7 g/dL (ref 6.1–8.1)

## 2020-01-07 LAB — ANAPLASMA PHAGOCYTOPHILA IGG/IGM AB
A. phagocytophila IgG Abs: <1:64 {titer}
A. phagocytophila IgM Abs: <1:20 {titer}

## 2020-01-07 LAB — THYROID PANEL WITH TSH
Free Thyroxine Index: 2.1 (ref 1.4–3.8)
T3 Uptake: 33 % (ref 22–35)
T4, Total: 6.5 ug/dL (ref 5.1–11.9)
TSH: 1.99 mIU/L

## 2020-01-07 LAB — CBC WITH DIFFERENTIAL/PLATELET
Absolute Monocytes: 449 cells/uL (ref 200–950)
Basophils Absolute: 111 cells/uL (ref 0–200)
Basophils Relative: 1.7 %
Eosinophils Absolute: 247 cells/uL (ref 15–500)
Eosinophils Relative: 3.8 %
HCT: 41 % (ref 35.0–45.0)
Hemoglobin: 13.5 g/dL (ref 11.7–15.5)
Lymphs Abs: 2659 cells/uL (ref 850–3900)
MCH: 29.7 pg (ref 27.0–33.0)
MCHC: 32.9 g/dL (ref 32.0–36.0)
MCV: 90.3 fL (ref 80.0–100.0)
MPV: 10.5 fL (ref 7.5–12.5)
Monocytes Relative: 6.9 %
Neutro Abs: 3036 cells/uL (ref 1500–7800)
Neutrophils Relative %: 46.7 %
Platelets: 312 10*3/uL (ref 140–400)
RBC: 4.54 10*6/uL (ref 3.80–5.10)
RDW: 13 % (ref 11.0–15.0)
Total Lymphocyte: 40.9 %
WBC: 6.5 10*3/uL (ref 3.8–10.8)

## 2020-01-07 LAB — LIPID PANEL
Cholesterol: 245 mg/dL — ABNORMAL HIGH (ref <200)
HDL: 43 mg/dL — ABNORMAL LOW (ref >=50)
LDL Cholesterol (Calc): 170 mg/dL (calc) — ABNORMAL HIGH
Non-HDL Cholesterol (Calc): 202 mg/dL (calc) — ABNORMAL HIGH (ref <130)
Total CHOL/HDL Ratio: 5.7 (calc) — ABNORMAL HIGH (ref <5.0)
Triglycerides: 169 mg/dL — ABNORMAL HIGH (ref <150)

## 2020-01-07 LAB — BABESIA MICROTI ANTIBODIES IGG, IGM
Babesia microti IgG: <1:64 {titer}
Babesia microti IgM: <1:20 {titer}

## 2020-01-07 LAB — HEMOGLOBIN A1C
Hgb A1c MFr Bld: 5.3 % of total Hgb (ref <5.7)
Mean Plasma Glucose: 105 (calc)
eAG (mmol/L): 5.8 (calc)

## 2020-01-07 LAB — EHRLICHIA ANTIBODY PANEL
E. CHAFFEENSIS AB IGG: <1:64 {titer}
E. CHAFFEENSIS AB IGM: <1:20 {titer}

## 2020-01-07 LAB — SEDIMENTATION RATE: Sed Rate: 2 mm/h (ref 0–30)

## 2020-01-07 LAB — C-REACTIVE PROTEIN: CRP: 3.3 mg/L (ref <8.0)

## 2020-01-07 LAB — ANA: Anti Nuclear Antibody (ANA): NEGATIVE

## 2020-01-07 LAB — B. BURGDORFI ANTIBODIES: B burgdorferi Ab IgG+IgM: <0.9 index

## 2020-01-12 ENCOUNTER — Other Ambulatory Visit: Payer: Self-pay | Admitting: *Deleted

## 2020-01-12 ENCOUNTER — Telehealth: Payer: Self-pay | Admitting: Family Medicine

## 2020-01-12 DIAGNOSIS — F331 Major depressive disorder, recurrent, moderate: Secondary | ICD-10-CM

## 2020-01-12 MED ORDER — FLUOXETINE HCL 20 MG PO TABS: 20.0000 mg | ORAL_TABLET | Freq: Every day | ORAL | 3 refills | Status: DC

## 2020-01-12 NOTE — Telephone Encounter (Signed)
RX REFILL FLUoxetine (PROZAC) 20 MG tablet  [facility]Y [facility] [address] [place], [place] - [address] Phone:  [phone]  Fax:  [phone]       Patient is requesting a capsules instead of tablets due to price.

## 2020-01-15 ENCOUNTER — Ambulatory Visit: Payer: Self-pay | Admitting: Pharmacist

## 2020-01-15 DIAGNOSIS — E782 Mixed hyperlipidemia: Secondary | ICD-10-CM

## 2020-01-15 DIAGNOSIS — F331 Major depressive disorder, recurrent, moderate: Secondary | ICD-10-CM

## 2020-01-15 NOTE — Chronic Care Management (AMB) (Signed)
Care Management   Follow Up Note   01/15/2020 Name: Karen Martinez MRN: [id number] DOB: 09/12/1965  Referred by: Karen Bangs, FNP Reason for referral : Chronic Care Management (Patient Phone Call)   Karen Martinez is a 54 y.o. year old female who is a primary care patient of Karen Martinez, Karen Martinez, Karen Martinez. The care management team was consulted for assistance with care management and care coordination needs.    Outreach to Karen Martinez today by phone.  Review of patient status, including review of consultants reports, relevant laboratory and other test results, and collaboration with appropriate care team members and the patient's provider was performed as part of comprehensive patient evaluation and provision of chronic care management services.    SDOH (Social Determinants of Health) assessments performed: No See Care Plan activities for detailed interventions related to [facility])     Advanced Directives: See Care Plan and Vynca application for related entries.   Goals Addressed            This Visit's Progress   . PharmD - medication assistance       CARE PLAN ENTRY (see longtitudinal plan of care for additional care plan information)   Current Barriers:  . Chronic Disease Management support, education, and care coordination needs related to Depression, hyperlipidemia and Asthma  Pharmacist Clinical Goal(s):  Marland Kitchen Over the next 30 days, patient will work with CM Pharmacist to address needs related to medication assistance and complete medication review  Interventions: . Follow up regarding sedation with fluoxetine. o Note previously collaborated with PCP regarding patient's report of day time sedation with fluoxetine. Provider advised "She can certainly reduce to the 10mg . If she notices that she is having less control on her anxiety and depression with the 10mg , she can take the 20mg  but take it closer to dinner time, so it would hopefully provide less sedation the next day." o Karen Martinez reports she tried taking floxetine 20 mg closer to dinner time, but has continued to struggle with daytime sedation.  o Reports plans to now try the fluoxetine 20 mg - 1/2 tablet (10 mg) once daily as previously discussed. - Note patient recently requested capsule form of fluoxetine 20 mg for cost savings. Note tablet form allows for spliting of dose. Marland Kitchen Discuss importance of LDL control.  o Note patient's latest LDL: 170 mg/dL on 5/4 o From review of chart, note PCP recommended low dose statin, reduction of dietary cholesterol intake and increased exercise. Patient expressed reluctance to start statin, wanting to try cinnamon supplement instead first. o Reports taking: [place] Cinnamon 1200 mg twice daily - Reports feels confident based on personal and family experience with using cinnamon for cholesterol improvements - Advise patient that based on data from [employer] [place], there is insufficient evidence to suggest that cinnamon reduces LDL. - Caution patient about limited data with supplements as ,unlike prescription medications, these do not undergo FDA approval and there may be insufficient data to confirm safety, efficacy or potential for interactions.  . Patient notes scheduled for appointment with PCP tomorrow. Patient interested in having Vitamin D level checked. Will follow up with PCP. o Note patient has reported Vitamin D deficiency in the past. From chart review, see level last checked in 05/2016.  Patient Self Care Activities:  . Attends all scheduled provider appointments . Calls pharmacy for medication refills . Calls provider office for new concerns or questions   Please see past updates related to this goal by  clicking on the "Past Updates" button in the selected goal         Plan  The care management team will reach out to the patient again over the next 90 days.   Duanne Moron, PharmD, [facility] Clinical Pharmacist [facility] [employer] [phone]

## 2020-01-15 NOTE — Patient Instructions (Signed)
Thank you allowing the Care Management Team to be a part of your care! It was a pleasure speaking with you today!     Care Management Team    Alto Denver RN, MSN, CCM Nurse Care Coordinator  [phone]   Duanne Moron PharmD  Clinical Pharmacist  [phone]   Dickie La LCSW Clinical Social Worker [phone]   Visit Information  Goals Addressed            This Visit's Progress   . PharmD - medication assistance       CARE PLAN ENTRY (see longtitudinal plan of care for additional care plan information)   Current Barriers:  . Chronic Disease Management support, education, and care coordination needs related to Depression, hyperlipidemia and Asthma  Pharmacist Clinical Goal(s):  Marland Kitchen Over the next 30 days, patient will work with CM Pharmacist to address needs related to medication assistance and complete medication review  Interventions: . Follow up regarding sedation with fluoxetine. o Note previously collaborated with PCP regarding patient's report of day time sedation with fluoxetine. Provider advised "She can certainly reduce to the 10mg . If she notices that she is having less control on her anxiety and depression with the 10mg , she can take the 20mg  but take it closer to dinner time, so it would hopefully provide less sedation the next day." o Ms. Hacking reports she tried taking floxetine 20 mg closer to dinner time, but has continued to struggle with daytime sedation.  o Reports plans to now try the fluoxetine 20 mg - 1/2 tablet (10 mg) once daily as previously discussed. - Note patient recently requested capsule form of fluoxetine 20 mg for cost savings. Note tablet form allows for spliting of dose. Discuss importance of LDL control.  o Note patient's latest LDL: 170 mg/dL on 5/4 o From review of chart, note PCP recommended low dose statin, reduction of dietary cholesterol intake and increased exercise. Patient expressed reluctance to start statin, wanting  to try cinnamon supplement instead first. o Reports taking: [place] Cinnamon 1200 mg twice daily - Reports feels confident based on personal and family experience with using cinnamon for cholesterol improvements - Advise patient that based on data from [facility], there is insufficient evidence to suggest that cinnamon reduces LDL. - Caution patient about limited data with supplements as ,unlike prescription medications, these do not undergo FDA approval and there may be insufficient data to confirm safety, efficacy or potential for interactions.  . Patient notes scheduled for appointment with PCP tomorrow. Patient interested in having Vitamin D level checked. Will follow up with PCP. o Note patient has reported Vitamin D deficiency in the past. From chart review, see level last checked in 05/2016.  Patient Self Care Activities:  . Attends all scheduled provider appointments . Calls pharmacy for medication refills . Calls provider office for new concerns or questions   Please see past updates related to this goal by clicking on the "Past Updates" button in the selected goal         Patient verbalizes understanding of instructions provided today.   The care management team will reach out to the patient again over the next 90 days.   Marland Kitchen, PharmD, [facility] Clinical Pharmacist [facility] Duanne Moron [phone]

## 2020-01-16 ENCOUNTER — Other Ambulatory Visit (HOSPITAL_COMMUNITY)
Admission: RE | Admit: 2020-01-16 | Discharge: 2020-01-16 | Disposition: A | Discharge status: Home / Self Care | Payer: 59 | Source: Ambulatory Visit | Attending: Family Medicine | Admitting: Family Medicine

## 2020-01-16 ENCOUNTER — Ambulatory Visit (INDEPENDENT_AMBULATORY_CARE_PROVIDER_SITE_OTHER): Payer: 59 | Admitting: Family Medicine

## 2020-01-16 ENCOUNTER — Other Ambulatory Visit: Payer: Self-pay

## 2020-01-16 ENCOUNTER — Encounter: Payer: Self-pay | Admitting: Family Medicine

## 2020-01-16 VITALS — BP 141/61 | HR 55 | Temp 97.1°F | Ht 63.0 in | Wt 155.0 lb

## 2020-01-16 DIAGNOSIS — Z124 Encounter for screening for malignant neoplasm of cervix: Secondary | ICD-10-CM | POA: Insufficient documentation

## 2020-01-16 DIAGNOSIS — Z1211 Encounter for screening for malignant neoplasm of colon: Secondary | ICD-10-CM

## 2020-01-16 DIAGNOSIS — Z1231 Encounter for screening mammogram for malignant neoplasm of breast: Secondary | ICD-10-CM

## 2020-01-16 DIAGNOSIS — Z01419 Encounter for gynecological examination (general) (routine) without abnormal findings: Secondary | ICD-10-CM | POA: Insufficient documentation

## 2020-01-16 DIAGNOSIS — Z23 Encounter for immunization: Secondary | ICD-10-CM | POA: Diagnosis not present

## 2020-01-16 NOTE — Patient Instructions (Signed)
We have sent your PAP to the lab for testing.  Will contact you once the results are received.  For Mammogram screening for breast cancer   Call the Imaging Center below anytime to schedule your own appointment now that order has been placed.  [facility] [address] [place], [place] [address] Phone: [phone]  [facility] Outpatient Radiology [address] [place], [place] [address] Phone: [phone]  Colon Cancer Screening: - For all adults age 70+ routine colon cancer screening is highly recommended.     - Recent guidelines from American Cancer Society recommend starting age of 60 - Early detection of colon cancer is important, because often there are no warning signs or symptoms, also if found early usually it can be cured. Late stage is hard to treat. - Special circumstances in patients with early family history of colon cancer, we recommend colonoscopy at a younger age (usually 10 years before the family member was diagnosed).  - Colonoscopy is the best test for colon cancer because it involves direct visualization and immediate treatment (compared to other imaging studies or stool cards to test for blood, that will require you to eventually get a colonoscopy if they are abnormal).  I have put in a referral to Gastoenterology for colonoscopy.  You should receive a call from their office.  Well Visit, Ages 27 to 62: Care Instructions Overview  Well visits can help you stay healthy. Your provider has checked your overall health and may have suggested ways to take good care of yourself. Your provider also may have recommended tests. At home, you can help prevent illness with healthy eating, regular exercise, and other steps.  Follow-up care is a key part of your treatment and safety. Be sure to make and go to all appointments, and call your provider if you are having problems. It's also a good idea to know your test results and  keep a list of the medicines you take.  How can you care for yourself at home?   Get screening tests that you and your doctor decide on. Screening helps find diseases before any symptoms appear.   Eat healthy foods. Choose fruits, vegetables, whole grains, protein, and low-fat dairy foods. Limit fat, especially saturated fat. Reduce salt in your diet.   Limit alcohol. If you are a man, have no more than 2 drinks a day or 14 drinks a week. If you are a woman, have no more than 1 drink a day or 7 drinks a week.   Get at least 30 minutes of physical activity on most days of the week.  We recommend you go no more than 2 days in a row without exercise. Walking is a good choice. You also may want to do other activities, such as running, swimming, cycling, or playing tennis or team sports. Discuss any changes in your exercise program with your provider.   Reach and stay at a healthy weight. This will lower your risk for many problems, such as obesity, diabetes, heart disease, and high blood pressure.   Do not smoke or allow others to smoke around you. If you need help quitting, talk to your provider about stop-smoking programs and medicines. These can increase your chances of quitting for good.  Can call 1-800-QUIT-NOW (714-057-5853) for the [facility], assistance with smoking cessation.   Care for your mental health. It is easy to get weighed down by worry and stress. Learn strategies to manage stress, like  deep breathing and mindfulness, and stay connected with your family and community. If you find you often feel sad or hopeless, talk with your provider. Treatment can help.   Talk to your provider about whether you have any risk factors for sexually transmitted infections (STIs). You can help prevent STIs if you wait to have sex with a new partner (or partners) until you've each been tested for STIs. It also helps if you use condoms (female or female condoms) and if you limit your  sex partners to one person who only has sex with you. Vaccines are available for some STIs, such as HPV (these are age dependent).   Use birth control if it's important to you to prevent pregnancy. Talk with your provider about the choices available and what might be best for you.   If you think you may have a problem with alcohol or drug use, talk to your provider. This includes prescription medicines (such as amphetamines and opioids) and illegal drugs (such as cocaine and methamphetamine). Your provider can help you figure out what type of treatment is best for you.   If you have concerns about domestic violence or intimate partner violence, there are resources available to you. National Domestic Abuse Hotline 845-362-9624   Protect your skin from too much sun. When you're outdoors from 10 a.m. to 4 p.m., stay in the shade or cover up with clothing and a hat with a wide brim. Wear sunglasses that block UV rays. Even when it's cloudy, put broad-spectrum sunscreen (SPF 30 or higher) on any exposed skin.   See a dentist one or two times a year for checkups and to have your teeth cleaned.   See an eye doctor once per year for an eye exam.   Wear a seat belt in the car.  When should you call for help?  Watch closely for changes in your health, and be sure to contact your provider if you have any problems or symptoms that concern you.  We will plan to see you back in 1 year for your next wellness visit   You will receive a survey after today's visit either digitally by e-mail or paper by [place] mail. Your experiences and feedback matter to [place].  Please respond so we know how we are doing as we provide care for you.  Call us with any questions/concerns/needs.  It is my goal to be available to you for your health concerns.  Thanks for choosing me to be a partner in your healthcare needs!  Harlin Rain, FNP-C Family Nurse Practitioner [place]  [facility] Phone: [phone]

## 2020-01-16 NOTE — Assessment & Plan Note (Signed)
Well woman exam without new findings.  Well adult with no acute concerns.  Plan: 1. Obtain health maintenance screenings as above according to age. - Increase physical activity to 30 minutes most days of the week.  - Eat healthy diet high in vegetables and fruits; low in refined carbohydrates. - Screening labs and tests as ordered -Mammogram ordered -Colonoscopy referral placed -PAP testing completed today -TDAP vaccine given today 2. Return 1 year for annual physical.

## 2020-01-16 NOTE — Assessment & Plan Note (Signed)
TDAP needed and provided today.

## 2020-01-16 NOTE — Progress Notes (Signed)
Subjective:    Patient ID: Karen Martinez, female    DOB: [birth year], 54 y.o.   MRN: [id number]  Karen Martinez is a 54 y.o. female presenting on 01/16/2020 for Annual Exam   HPI  HEALTH MAINTENANCE:  Weight/BMI: Remains stable from last visit Physical activity: Remains active Diet: Regular Seatbelt: Yes Sunscreen: Yes Mammogram: Ordered today PAP: Completed today Colon cancer screening: GI referral placed Optometry: Regularly Dentistry: Regularly  IMMUNIZATIONS: Influenza: Due next season Tetanus: Completed today COVID: 11/10/2019, 12/01/2019 [place]  Depression screen [facility] 2/9 11/14/2019 10/21/2015 09/17/2015  Decreased Interest 3 1 0  Down, Depressed, Hopeless 2 1 0  PHQ - 2 Score 5 2 0  Altered sleeping 2 2 1   Tired, decreased energy 3 2 1   Change in appetite 0 0 1  Feeling bad or failure about yourself  3 1 1   Trouble concentrating 0 0 1  Moving slowly or fidgety/restless 1 0 0  Suicidal thoughts 2 0 0  PHQ-9 Score 16 7 5   Difficult doing work/chores Very difficult - Somewhat difficult    Past Medical History:  Diagnosis Date  . Allergy   . Asthma   . Depression   . Emphysema of lung ([place])   . Heart murmur    heart arrhythmias  . Hyperlipidemia   . Thyroid disease   . Urine incontinence    Past Surgical History:  Procedure Laterality Date  . BREAST SURGERY  2011   breast biopsy----cyst drainage on breast   Social History   Socioeconomic History  . Marital status: Married    Spouse name: Not on file  . Number of children: Not on file  . Years of education: Not on file  . Highest education level: Not on file  Occupational History  . Not on file  Tobacco Use  . Smoking status: Never Smoker  . Smokeless tobacco: Never Used  Substance and Sexual Activity  . Alcohol use: Yes    Alcohol/week: 1.0 standard drinks    Types: 1 Glasses of wine per week    Comment: 5 glasses wine a week   . Drug use: No  . Sexual activity: Not on file  Other Topics  Concern  . Not on file  Social History Narrative  . Not on file   Social Determinants of Health   Financial Resource Strain:   . Difficulty of Paying Living Expenses:   Food Insecurity:   . Worried About Charity fundraiser in the Last Year:   . Arboriculturist in the Last Year:   Transportation Needs:   . Film/video editor (Medical):   Marland Kitchen Lack of Transportation (Non-Medical):   Physical Activity:   . Days of Exercise per Week:   . Minutes of Exercise per Session:   Stress:   . Feeling of Stress :   Social Connections:   . Frequency of Communication with Friends and Family:   . Frequency of Social Gatherings with Friends and Family:   . Attends Religious Services:   . Active Member of Clubs or Organizations:   . Attends Archivist Meetings:   Marland Kitchen Marital Status:   Intimate Partner Violence:   . Fear of Current or Ex-Partner:   . Emotionally Abused:   Marland Kitchen Physically Abused:   . Sexually Abused:    Family History  Problem Relation Age of Onset  . Stroke Father   . Heart disease Father   . Cancer Father  facial   . Hyperlipidemia Father   . Cancer Maternal Grandfather   . Heart disease Maternal Grandfather   . Alcohol abuse Maternal Grandfather   . Alcohol abuse Paternal Grandfather    Current Outpatient Medications on File Prior to Visit  Medication Sig  . acetaminophen (TYLENOL) 500 MG tablet Take 1,000 mg by mouth in the morning and at bedtime.  Marland Kitchen albuterol (VENTOLIN HFA) 108 (90 Base) MCG/ACT inhaler Inhale 2 puffs into the lungs every 6 (six) hours as needed for wheezing or shortness of breath.  Marland Kitchen CINNAMON PO Take 1,200 mg by mouth 2 (two) times daily.  Marland Kitchen FLUoxetine (PROZAC) 20 MG tablet Take 1 tablet (20 mg total) by mouth daily. (Patient taking differently: Take 10 mg by mouth daily. )  . loratadine (CLARITIN) 10 MG tablet Take 10 mg by mouth daily.  . Omega-3 Fatty Acids (FISH OIL) 1000 MG CAPS Take by mouth.  . triamcinolone (NASACORT AQ) 55  MCG/ACT AERO nasal inhaler Place 2 sprays into the nose daily.  Marland Kitchen atorvastatin (LIPITOR) 10 MG tablet Take 1 tablet (10 mg total) by mouth daily. (Patient not taking: Reported on 01/16/2020)  . calcium-vitamin D (OSCAL WITH D) 250-125 MG-UNIT tablet Take 1 tablet by mouth daily.  . Magnesium Citrate 100 MG TABS Take 1 tablet by mouth 2 (two) times daily.  . Multiple Vitamins-Minerals (ONE-A-DAY 50 PLUS PO) Take by mouth.  . naproxen (NAPROSYN) 500 MG tablet Take 500 mg by mouth at bedtime as needed.   No current facility-administered medications on file prior to visit.    Per HPI unless specifically indicated above     Objective:    BP (!) 141/61 (BP Location: Left Arm, Patient Position: Sitting, Cuff Size: Normal)   Pulse (!) 55   Temp (!) 97.1 F (36.2 C) (Temporal)   Ht 5' 3"  (1.6 m)   Wt 155 lb (70.3 kg)   LMP 01/15/2018   BMI 27.46 kg/m   Wt Readings from Last 3 Encounters:  01/16/20 155 lb (70.3 kg)  12/14/19 154 lb 6.4 oz (70 kg)  11/14/19 157 lb 6.4 oz (71.4 kg)    Physical Exam Vitals reviewed.  Constitutional:      General: She is not in acute distress.    Appearance: Normal appearance. She is well-developed, well-groomed and overweight. She is not ill-appearing or toxic-appearing.  HENT:     Head: Normocephalic.  Eyes:     General: Lids are normal. Vision grossly intact.        Right eye: No discharge.        Left eye: No discharge.     Extraocular Movements: Extraocular movements intact.     Conjunctiva/sclera: Conjunctivae normal.     Pupils: Pupils are equal, round, and reactive to light.  Cardiovascular:     Pulses: Normal pulses.     Heart sounds: No friction rub.  Pulmonary:     Effort: Pulmonary effort is normal. No respiratory distress.  Abdominal:     General: There is no distension.     Tenderness: There is no abdominal tenderness. There is no guarding.     Hernia: There is no hernia in the left inguinal area or right inguinal area.    Genitourinary:    General: Normal vulva.     Exam position: Lithotomy position.     Pubic Area: No rash or pubic lice.      Labia:        Right: No rash, tenderness, lesion or injury.  Left: No rash, tenderness, lesion or injury.      Urethra: No urethral pain, urethral swelling or urethral lesion.     Vagina: Normal. No vaginal discharge.     Cervix: Normal.     Uterus: Normal.      Adnexa: Right adnexa normal and left adnexa normal.  Musculoskeletal:     Right lower leg: No edema.     Left lower leg: No edema.  Lymphadenopathy:     Lower Body: No right inguinal adenopathy. No left inguinal adenopathy.  Skin:    General: Skin is warm and dry.     Capillary Refill: Capillary refill takes less than 2 seconds.  Neurological:     General: No focal deficit present.     Mental Status: She is alert and oriented to person, place, and time.     Cranial Nerves: No cranial nerve deficit.     Sensory: No sensory deficit.     Motor: No weakness.     Coordination: Coordination normal.     Gait: Gait normal.  Psychiatric:        Attention and Perception: Attention and perception normal.        Mood and Affect: Mood and affect normal.        Speech: Speech normal.        Behavior: Behavior normal. Behavior is cooperative.        Thought Content: Thought content normal.        Cognition and Memory: Cognition and memory normal.        Judgment: Judgment normal.     Results for orders placed or performed in visit on 11/14/19  CBC with Differential  Result Value Ref Range   WBC 6.5 3.8 - 10.8 Thousand/uL   RBC 4.54 3.80 - 5.10 Million/uL   Hemoglobin 13.5 11.7 - 15.5 g/dL   HCT 41.0 35.0 - 45.0 %   MCV 90.3 80.0 - 100.0 fL   MCH 29.7 27.0 - 33.0 pg   MCHC 32.9 32.0 - 36.0 g/dL   RDW 13.0 11.0 - 15.0 %   Platelets 312 140 - 400 Thousand/uL   MPV 10.5 7.5 - 12.5 fL   Neutro Abs 3,036 1,500 - 7,800 cells/uL   Lymphs Abs 2,659 850 - 3,900 cells/uL   Absolute Monocytes 449 200 -  950 cells/uL   Eosinophils Absolute 247 15 - 500 cells/uL   Basophils Absolute 111 0 - 200 cells/uL   Neutrophils Relative % 46.7 %   Total Lymphocyte 40.9 %   Monocytes Relative 6.9 %   Eosinophils Relative 3.8 %   Basophils Relative 1.7 %  Comprehensive Metabolic Panel (CMET)  Result Value Ref Range   Glucose, Bld 86 65 - 99 mg/dL   BUN 8 7 - 25 mg/dL   Creat 0.69 0.50 - 1.05 mg/dL   BUN/Creatinine Ratio NOT APPLICABLE 6 - 22 (calc)   Sodium 140 135 - 146 mmol/L   Potassium 4.1 3.5 - 5.3 mmol/L   Chloride 104 98 - 110 mmol/L   CO2 26 20 - 32 mmol/L   Calcium 9.7 8.6 - 10.4 mg/dL   Total Protein 7.0 6.1 - 8.1 g/dL   Albumin 4.3 3.6 - 5.1 g/dL   Globulin 2.7 1.9 - 3.7 g/dL (calc)   AG Ratio 1.6 1.0 - 2.5 (calc)   Total Bilirubin 0.6 0.2 - 1.2 mg/dL   Alkaline phosphatase (APISO) 82 37 - 153 U/L   AST 10 10 - 35 U/L   ALT  11 6 - 29 U/L  Thyroid Panel With TSH  Result Value Ref Range   T3 Uptake 33 22 - 35 %   T4, Total 6.5 5.1 - 11.9 mcg/dL   Free Thyroxine Index 2.1 1.4 - 3.8   TSH 1.99 mIU/L  HgB A1c  Result Value Ref Range   Hgb A1c MFr Bld 5.3 <5.7 % of total Hgb   Mean Plasma Glucose 105 (calc)   eAG (mmol/L) 5.8 (calc)  Lipid Profile  Result Value Ref Range   Cholesterol 245 (H) <200 mg/dL   HDL 43 (L) > OR = 50 mg/dL   Triglycerides 169 (H) <150 mg/dL   LDL Cholesterol (Calc) 170 (H) mg/dL (calc)   Total CHOL/HDL Ratio 5.7 (H) <5.0 (calc)   Non-HDL Cholesterol (Calc) 202 (H) <130 mg/dL (calc)  C-reactive protein  Result Value Ref Range   CRP 3.3 <8.0 mg/L  Sed Rate (ESR)  Result Value Ref Range   Sed Rate 2 0 - 30 mm/h  B. burgdorfi antibodies  Result Value Ref Range   B burgdorferi Ab IgG+IgM <1.61 index  Ehrlichia Antibody Panel  Result Value Ref Range   E. CHAFFEENSIS AB IGG <1:64 <1:64   E. CHAFFEENSIS AB IGM <1:20 <1:20   INTERPRETATION see note    COMMENT see note   Anaplasma Phagocytophila IgG/IgM Ab  Result Value Ref Range   A.  phagocytophila IgG Abs <1:64 <1:64   A. phagocytophila IgM Abs <1:20 <1:20   Interpretation see note    Comment see note   Antinuclear Antib (ANA)  Result Value Ref Range   Anti Nuclear Antibody (ANA) NEGATIVE NEGATIVE  Babesia microti Antibodies IgG, IgM  Result Value Ref Range   Babesia microti IgG <1:64 <1:64 titer   Babesia microti IgM <1:20 <1:20 titer   Interpretation see note       Assessment & Plan:   Problem List Items Addressed This Visit      Other   Well woman exam with routine gynecological exam    Well woman exam without new findings.  Well adult with no acute concerns.  Plan: 1. Obtain health maintenance screenings as above according to age. - Increase physical activity to 30 minutes most days of the week.  - Eat healthy diet high in vegetables and fruits; low in refined carbohydrates. - Screening labs and tests as ordered -Mammogram ordered -Colonoscopy referral placed -PAP testing completed today -TDAP vaccine given today 2. Return 1 year for annual physical.       Immunization due    TDAP needed and provided today.       Other Visit Diagnoses    Cervical cancer screening    -  Primary   Relevant Orders   Cytology - PAP   Colon cancer screening       Relevant Orders   Ambulatory referral to Gastroenterology   Encounter for screening mammogram for malignant neoplasm of breast       Relevant Orders   MM 3D SCREEN BREAST BILATERAL   Need for diphtheria-tetanus-pertussis (Tdap) vaccine       Relevant Orders   Tdap vaccine greater than or equal to 7yo IM (Completed)      No orders of the defined types were placed in this encounter.     Follow up plan: Return in about 1 year (around 01/15/2021) for CPE.  Harlin Rain, FNP-C Family Nurse Practitioner [facility] 01/16/2020, 3:46 PM

## 2020-01-18 ENCOUNTER — Telehealth: Payer: Self-pay

## 2020-01-18 ENCOUNTER — Encounter: Payer: Self-pay | Admitting: *Deleted

## 2020-01-19 LAB — CYTOLOGY - PAP: Diagnosis: NEGATIVE

## 2020-02-05 ENCOUNTER — Telehealth: Payer: Self-pay

## 2020-02-05 NOTE — Telephone Encounter (Signed)
Copied from CRM [id number]. Topic: General - Call Back - No Documentation >> Feb 05, 2020 12:19 PM Randol Kern wrote: Reason for CRM: Pt is requesting a call back to discuss her medication, FLUoxetine (PROZAC) 20 MG tablet. Has questions/concerns Best contact: [phone]   Attempted to contact the patient back to f/u on her concerns with her Fluoxetine, no answer. Left detail message on the patient vm.  When the patient calls back please find out what is her concerns with the medications. So I can better address this with her when I return her call.

## 2020-02-08 NOTE — Telephone Encounter (Signed)
Attempted to f/u with the patient again, no answer. LMOM to return my call.

## 2020-02-27 ENCOUNTER — Telehealth: Payer: Self-pay

## 2020-02-27 NOTE — Telephone Encounter (Signed)
Called pt I got the information for the sleepy study will give them a call tomorrow. Pt verbalized understanding.  KP

## 2020-02-27 NOTE — Telephone Encounter (Signed)
Did she receive a call from the sleep study location about this?  Can you obtain the phone number from the provider she is using for her sleep study so we can find out if it needs prior authorization or an in office visit to get approval for the repeat sleep study?  Thanks

## 2020-02-27 NOTE — Telephone Encounter (Signed)
Copied from CRM [id number]. Topic: General - Other >> Feb 26, 2020  3:19 PM Lyn Hollingshead D wrote: Reason for CRM: PT need to speak with Danielle Rankin about her sleep study, her insurance will not cover it.

## 2020-02-29 NOTE — Telephone Encounter (Signed)
Called Benita Gutter from [employer] Phone# [phone]. Asked him if pt needed a prior auth. Or office visit to get approval. He stated that she may need a prior auth. That he would look into it. Told Russel to give [place] a call back.  KP

## 2020-03-27 ENCOUNTER — Ambulatory Visit: Payer: Self-pay | Admitting: Pharmacist

## 2020-03-27 DIAGNOSIS — E782 Mixed hyperlipidemia: Secondary | ICD-10-CM

## 2020-03-27 DIAGNOSIS — F331 Major depressive disorder, recurrent, moderate: Secondary | ICD-10-CM

## 2020-03-27 NOTE — Chronic Care Management (AMB) (Signed)
  Care Management   Follow Up Note   03/27/2020 Name: [name] MRN: [id number] DOB: July 27, 1966  Referred by: Tarri Fuller, FNP Reason for referral : Chronic Care Management (Patient Phone Call)   Karen Martinez is a 54 y.o. year old female who is a primary care patient of Anitra Lauth, Jodelle Gross, FNP. The care management team was consulted for assistance with care management and care coordination needs.    Outreach to [place] by phone today.  Review of patient status, including review of consultants reports, relevant laboratory and other test results, and collaboration with appropriate care team members and the patient's provider was performed as part of comprehensive patient evaluation and provision of chronic care management services.    SDOH (Social Determinants of Health) assessments performed: No See Care Plan activities for detailed interventions related to [facility])     Advanced Directives: See Care Plan and Vynca application for related entries.   Goals Addressed            This Visit's Progress   . PharmD - medication assistance       CARE PLAN ENTRY (see longtitudinal plan of care for additional care plan information)   Current Barriers:  . Chronic Disease Management support, education, and care coordination needs related to Depression, hyperlipidemia and Asthma  Pharmacist Clinical Goal(s):  Marland Kitchen Over the next 30 days, patient will work with CM Pharmacist to address needs related to medication assistance and complete medication review  Interventions: . Follow up regarding depression/medication management o Note PCP previously advised patient could reduce fluoxetine dose to take fluoxetine 20mg  - 1/2 tablet (10 mg) once daily if needed due to side effect of day-time sedation. o Reports she continued to have some day-time sedation with the 10 mg/day and has instead been taking fluoxetine 20 mg - 1/4 tablet (5 mg) once daily. - Reports feels like her depression is well  managed at this dose, while no longer having sedation side effects - Note concern for cosistency of dosing with spliting tablets twice. . Note capsule dosage forms of fluoxetine preferred under patient's insurance plan. . Review alternative medication savings options. Fluoxetine 10 mg tablets listed on [facility] $4 generic list  . Follow up regarding cholesterol control o Reports has been increasing her exercise - swimming at the pool and walking more. Reports primarily focusing on being more active/sitting less  . Encourage patient to schedule an appointment with PCP as reports thinks that she may have yeast infection. o Reports that she will call to schedule this today. . Will collaborate with PCP regarding patient's depression medication management  Patient Self Care Activities:  . Attends all scheduled provider appointments . Calls pharmacy for medication refills . Calls provider office for new concerns or questions   Please see past updates related to this goal by clicking on the "Past Updates" button in the selected goal         Plan  The care management team will reach out to the patient again over the next 30 days.   , PharmD, [facility] Clinical Pharmacist [facility] Care Management [phone]

## 2020-03-27 NOTE — Patient Instructions (Signed)
Thank you allowing the Care Management Team to be a part of your care! It was a pleasure speaking with you today!     Care Management Team    Alto Denver RN, MSN, CCM Nurse Care Coordinator  [phone]   Duanne Moron PharmD  Clinical Pharmacist  [phone]   Dickie La LCSW Clinical Social Worker [phone]   Visit Information  Goals Addressed            This Visit's Progress   . PharmD - medication assistance       CARE PLAN ENTRY (see longtitudinal plan of care for additional care plan information)   Current Barriers:  . Chronic Disease Management support, education, and care coordination needs related to Depression, hyperlipidemia and Asthma  Pharmacist Clinical Goal(s):  Marland Kitchen Over the next 30 days, patient will work with CM Pharmacist to address needs related to medication assistance and complete medication review  Interventions: . Follow up regarding depression/medication management o Note PCP previously advised patient could reduce fluoxetine dose to take fluoxetine 20mg  - 1/2 tablet (10 mg) once daily if needed due to side effect of day-time sedation. o Reports she continued to have some day-time sedation with the 10 mg/day and has instead been taking fluoxetine 20 mg - 1/4 tablet (5 mg) once daily. - Reports feels like her depression is well managed at this dose, while no longer having sedation side effects - Note concern for cosistency of dosing with spliting tablets twice. . Note capsule dosage forms of fluoxetine preferred under patient's insurance plan. . Review alternative medication savings options. Fluoxetine 10 mg tablets listed on [facility] $4 generic list  . Follow up regarding cholesterol control o Reports has been increasing her exercise - swimming at the pool and walking more. Reports primarily focusing on being more active/sitting less  . Encourage patient to schedule an appointment with PCP as reports thinks that she may have yeast  infection. o Reports that she will call to schedule this today. . Will collaborate with PCP regarding patient's depression medication management  Patient Self Care Activities:  . Attends all scheduled provider appointments . Calls pharmacy for medication refills . Calls provider office for new concerns or questions   Please see past updates related to this goal by clicking on the "Past Updates" button in the selected goal         Patient verbalizes understanding of instructions provided today.   The care management team will reach out to the patient again over the next 30 days.   , PharmD, [facility] Clinical Pharmacist [facility] [phone]

## 2020-03-29 ENCOUNTER — Other Ambulatory Visit: Payer: Self-pay | Admitting: Family Medicine

## 2020-03-29 ENCOUNTER — Ambulatory Visit: Payer: Self-pay | Admitting: Pharmacist

## 2020-03-29 DIAGNOSIS — F331 Major depressive disorder, recurrent, moderate: Secondary | ICD-10-CM

## 2020-03-29 MED ORDER — FLUOXETINE HCL 10 MG PO TABS: 5.0000 mg | ORAL_TABLET | Freq: Every day | ORAL | 3 refills | Status: DC

## 2020-03-29 NOTE — Chronic Care Management (AMB) (Signed)
  Care Management   Follow Up Note   03/29/2020 Name: Karen Martinez MRN: [id number] DOB: 1966/08/26  Referred by: Tarri Fuller, FNP Reason for referral : Chronic Care Management (Patient Phone Call)   Karen Martinez is a 54 y.o. year old female who is a primary care patient of Anitra Lauth, Jodelle Gross, FNP. The care management team was consulted for assistance with care management and care coordination needs.    Outreach to Karen Martinez by phone today.  Review of patient status, including review of consultants reports, relevant laboratory and other test results, and collaboration with appropriate care team members and the patient's provider was performed as part of comprehensive patient evaluation and provision of chronic care management services.    SDOH (Social Determinants of Health) assessments performed: No See Care Plan activities for detailed interventions related to [facility])     Advanced Directives: See Care Plan and Vynca application for related entries.   Goals Addressed            This Visit's Progress   . PharmD - medication assistance       CARE PLAN ENTRY (see longtitudinal plan of care for additional care plan information)   Current Barriers:  . Chronic Disease Management support, education, and care coordination needs related to Depression, hyperlipidemia and Asthma  Pharmacist Clinical Goal(s):  Marland Kitchen Over the next 30 days, patient will work with CM Pharmacist to address needs related to medication assistance and complete medication review  Interventions: . Collaborate with PCP. Provider Okay with patient to continue fluoxetine 5 mg once daily as patient reporting good control of symptoms and relief of day time sedation o Request provider send in new Rx for patient for fluoxetine 10 mg tablets -  tablet (5 mg) once daily to improve consistency of dosing (less pill splitting).  o Request Rx to be sent to [facility] for cost savings ($4 generic list) o Rx sent by  provider. . Outreach to Karen Martinez to follow up. Unable to reach patient by phone today.  Patient Self Care Activities:  . Attends all scheduled provider appointments . Calls pharmacy for medication refills . Calls provider office for new concerns or questions   Please see past updates related to this goal by clicking on the "Past Updates" button in the selected goal         Plan  The care management team will reach out to the patient again over the next 30 days.   Duanne Moron, PharmD, [facility] Clinical Pharmacist [facility] Care Management [phone]

## 2020-04-03 ENCOUNTER — Ambulatory Visit: Payer: Self-pay | Admitting: Pharmacist

## 2020-04-03 DIAGNOSIS — F331 Major depressive disorder, recurrent, moderate: Secondary | ICD-10-CM

## 2020-04-03 NOTE — Patient Instructions (Signed)
Thank you allowing the Care Management Team to be a part of your care! It was a pleasure speaking with you today!     Care Management Team    Alto Denver RN, MSN, CCM Nurse Care Coordinator  [phone]   Duanne Moron PharmD  Clinical Pharmacist  [phone]   Dickie La LCSW Clinical Social Worker [phone]   Visit Information  Goals Addressed            This Visit's Progress   . PharmD - medication assistance       CARE PLAN ENTRY (see longtitudinal plan of care for additional care plan information)   Current Barriers:  . Chronic Disease Management support, education, and care coordination needs related to Depression, hyperlipidemia and Asthma  Pharmacist Clinical Goal(s):  Marland Kitchen Over the next 30 days, patient will work with CM Pharmacist to address needs related to medication assistance and complete medication review  Interventions: . Collaborated with PCP. Provider Okay with patient to continue fluoxetine 5 mg once daily as patient reporting good control of symptoms and relief of day time sedation o Requested provider send in new Rx for patient for fluoxetine 10 mg tablets -  tablet (5 mg) once daily to improve consistency of dosing (less pill splitting).  o Requested Rx to be sent to [facility] for cost savings ($4 generic list) o Rx sent by provider on 7/30 . Follow up with Ms. Aguinaga today. Let patient know new Rx has been sent to [facility] by PCP . Again encourage patient to schedule follow up visit with PCP regarding concern for yeast infection.  Patient Self Care Activities:  . Attends all scheduled provider appointments . Calls pharmacy for medication refills . Calls provider office for new concerns or questions   Please see past updates related to this goal by clicking on the "Past Updates" button in the selected goal         Patient verbalizes understanding of instructions provided today.   The care management team will  reach out to the patient again over the next 3 months  Duanne Moron, PharmD, [facility] Clinical Pharmacist [facility] [phone]

## 2020-04-03 NOTE — Chronic Care Management (AMB) (Signed)
  Care Management   Follow Up Note   04/03/2020 Name: Karen Martinez MRN: [id number] DOB: March 06, 1966  Referred by: Tarri Fuller, FNP Reason for referral : Chronic Care Management (Patient Phone Call)   Karen Martinez is a 54 y.o. year old female who is a primary care patient of Anitra Lauth, Jodelle Gross, FNP. The care management team was consulted for assistance with care management and care coordination needs.    Outreach to [facility] by phone today.  Review of patient status, including review of consultants reports, relevant laboratory and other test results, and collaboration with appropriate care team members and the patient's provider was performed as part of comprehensive patient evaluation and provision of chronic care management services.    SDOH (Social Determinants of Health) assessments performed: No See Care Plan activities for detailed interventions related to [facility])     Advanced Directives: See Care Plan and Vynca application for related entries.   Goals Addressed            This Visit's Progress   . PharmD - medication assistance       CARE PLAN ENTRY (see longtitudinal plan of care for additional care plan information)   Current Barriers:  . Chronic Disease Management support, education, and care coordination needs related to Depression, hyperlipidemia and Asthma  Pharmacist Clinical Goal(s):  Marland Kitchen Over the next 30 days, patient will work with CM Pharmacist to address needs related to medication assistance and complete medication review  Interventions: . Collaborated with PCP. Provider Okay with patient to continue fluoxetine 5 mg once daily as patient reporting good control of symptoms and relief of day time sedation o Requested provider send in new Rx for patient for fluoxetine 10 mg tablets -  tablet (5 mg) once daily to improve consistency of dosing (less pill splitting).  o Requested Rx to be sent to [facility] for cost savings ($4 generic list) o Rx sent  by provider on 7/30 . Follow up with Ms. Kushnir today. Let patient know new Rx has been sent to [facility] by PCP . Again encourage patient to schedule follow up visit with PCP regarding concern for yeast infection.  Patient Self Care Activities:  . Attends all scheduled provider appointments . Calls pharmacy for medication refills . Calls provider office for new concerns or questions   Please see past updates related to this goal by clicking on the "Past Updates" button in the selected goal         Plan  The care management team will reach out to the patient again over the next 3 months  Duanne Moron, PharmD, [facility] Clinical Pharmacist [facility] Care Management [phone]

## 2020-04-24 ENCOUNTER — Telehealth: Payer: Self-pay

## 2020-04-24 NOTE — Telephone Encounter (Signed)
Copied from CRM [id number]. Topic: General - Inquiry >> Apr 22, 2020 12:55 PM Daphine Deutscher D wrote: Reason for CRM: Pt called saying she would like to talk to [facility] or her nurse about changing her rx of Fluexotine to something else.  She is having some side effects that she does ont like.  CB#  [phone]   Attempted to contact the patient to offer her an appt. No answer. LMOM

## 2020-04-29 ENCOUNTER — Telehealth: Payer: Self-pay

## 2020-05-08 ENCOUNTER — Telehealth: Payer: Self-pay | Admitting: Family Medicine

## 2020-05-08 NOTE — Telephone Encounter (Signed)
Copied from CRM [id number]. Topic: General - Other >> May 08, 2020 10:58 AM Gwenlyn Fudge wrote: Reason for CRM: Pt called stating that she has stopped taking her fluoxetine. Pt states that she stopped taking this 3 days ago due to the fatigue she was feeling from the medication. She is requesting to have something else sent in for her. Please advise.     Karin Golden [address] - [place], [place] - [address] [place] [address] Phone: [phone] Fax: [phone] Hours: Not open 24 hours

## 2020-05-09 NOTE — Telephone Encounter (Signed)
Appt scheduled

## 2020-05-09 NOTE — Telephone Encounter (Signed)
Patient needs appt

## 2020-05-13 ENCOUNTER — Telehealth: Payer: Self-pay | Admitting: Family Medicine

## 2020-05-13 ENCOUNTER — Encounter: Payer: Self-pay | Admitting: Family Medicine

## 2020-05-13 ENCOUNTER — Other Ambulatory Visit: Payer: Self-pay

## 2020-05-13 ENCOUNTER — Telehealth (INDEPENDENT_AMBULATORY_CARE_PROVIDER_SITE_OTHER): Payer: 59 | Admitting: Family Medicine

## 2020-05-13 DIAGNOSIS — F331 Major depressive disorder, recurrent, moderate: Secondary | ICD-10-CM

## 2020-05-13 DIAGNOSIS — F419 Anxiety disorder, unspecified: Secondary | ICD-10-CM | POA: Diagnosis not present

## 2020-05-13 MED ORDER — DULOXETINE HCL 30 MG PO CPEP: 30.0000 mg | ORAL_CAPSULE | Freq: Every day | ORAL | 1 refills | Status: DC

## 2020-05-13 NOTE — Patient Instructions (Signed)
As we discussed, to stop the fluoxetine 5mg  and to start on the duloxetine 30mg  daily.    The following recommendations are helpful adjuncts for helping rebalance your mood.  Eat a nourishing diet. Ensure adequate intake of calories, protein, carbs, fat, vitamins, and minerals. Prioritize whole foods at each meal, including meats, vegetables, fruits, nuts and seeds, etc.   Avoid inflammatory and/or "junk" foods, such as sugar, omega-6 fats, refined grains, chemicals, and preservatives are common in packaged and prepared foods. Minimize or completely avoid these ingredients and stick to whole foods with little to no additives. Cook from scratch as much as possible for more control over what you eat  Get enough sleep. Poor sleep is significantly associated with depression and anxiety. Make 7-9 hours of sleep nightly a top priority  Exercise appropriately. Exercise is known to improve brain functioning and boost mood. Aim for 30 minutes of daily physical activity. Avoid "overtraining," which can cause mental disturbances  Assess your light exposure. Not enough natural light during the day and too much artificial light can have a major impact on your mood. Get outside as often as possible during daylight hours. Minimize light exposure after dark and avoid the use of electronics that give off blue light before bed  Manage your stress.  Use daily stress management techniques such as meditation, yoga, or mindfulness to retrain your brain to respond differently to stress. Try deep breathing to deactivate your "fight or flight" response.  There are many of sources with apps like Headspace, Calm or a variety of YouTube videos (videos from have guided meditation)  Prioritize your social life. Work on building social support with new friends or improve current relationships. Consider getting a pet that allows for companionship, social interaction, and physical touch. Try volunteering or joining  a faith-based community to increase your sense of purpose  4-7-8 breathing technique at bedtime: breathe in to count of 4, hold breath for count of 7, exhale for count of 8; do 3-5 times for letting go of overactive thoughts  Take time to play Unstructured "play" time can help reduce anxiety and depression Options for play include music, games, sports, dance, art, etc.  Try to add daily omega 3 fatty acids, magnesium, B complex, and balanced amino acid supplements to help improve mood and anxiety.  We will plan to see you back in 4 weeks for anxiety/depression follow up visit  You will receive a survey after today's visit either digitally by e-mail or paper by USPS mail. Your experiences and feedback matter to .  Please respond so we know how we are doing as we provide care for you.  Call Romero Belling with any questions/concerns/needs.  It is my goal to be available to you for your health concerns.  Thanks for choosing me to be a partner in your healthcare needs!  [place], FNP-C Family Nurse Practitioner [facility] Phone: [phone]

## 2020-05-13 NOTE — Assessment & Plan Note (Signed)
Reports having fatigue while taking fluoxetine 10mg , had cut the dose down to 5mg  daily, then rotated between every other day and every 3 days dosing.  Interested in another medication that may help with her symptoms of anxiety and depression without increasing her fatigue.  Discussed SNRIs such as pristiq, effexor and duloxetine.  Patient interested in duloxetine trial at 30mg  daily.  Discussed if after a few weeks would like to increase to 60mg  daily, that is ok to do, we would just have a call to discuss and to send in a sooner refill.  Plan: 1. STOP Fluoxetine 5mg  daily and BEGIN duloxetine 30mg  daily 2. RTC in 4 weeks, sooner, if needed

## 2020-05-13 NOTE — Progress Notes (Signed)
Virtual Visit via Telephone  The purpose of this virtual visit is to provide medical care while limiting exposure to the novel coronavirus (COVID19) for both patient and office staff.  Consent was obtained for phone visit:  Yes.   Answered questions that patient had about telehealth interaction:  Yes.   I discussed the limitations, risks, security and privacy concerns of performing an evaluation and management service by telephone. I also discussed with the patient that there may be a patient responsible charge related to this service. The patient expressed understanding and agreed to proceed.  Patient is at home and is accessed via telephone Services are provided by Charlaine Dalton, FNP-C from [facility])  ---------------------------------------------------------------------- Chief Complaint  Patient presents with  . Anxiety    pt concern that the Prozac possibly causing her to be fatigue. Anxiety is more elevated today, because       S: Reviewed CMA documentation. I have called patient and gathered additional HPI as follows:  Karen Martinez presents to clinic for follow up on her anxiety and depression.  Reports that she has been taking the fluoxetine 5mg  daily, every other day and spacing out to every 3 days, finds that it is causing sedation and spacing out the dosing is causing her anxiety/depression to worsen.  Interested in looking into a different medication.  Patient is currently home Denies any high risk travel to areas of current concern for COVID19. Denies any known or suspected exposure to person with or possibly with COVID19.  Past Medical History:  Diagnosis Date  . Allergy   . Asthma   . Depression   . Emphysema of lung (HCC)   . Heart murmur    heart arrhythmias  . Hyperlipidemia   . Thyroid disease   . Urine incontinence    Social History   Tobacco Use  . Smoking status: Never Smoker  . Smokeless tobacco: Never Used  Substance Use  Topics  . Alcohol use: Yes    Alcohol/week: 1.0 standard drink    Types: 1 Glasses of wine per week    Comment: 5 glasses wine a week   . Drug use: No    Current Outpatient Medications:  .  albuterol (VENTOLIN HFA) 108 (90 Base) MCG/ACT inhaler, Inhale 2 puffs into the lungs every 6 (six) hours as needed for wheezing or shortness of breath., Disp: 6.7 g, Rfl: 1 .  CINNAMON PO, Take 1,200 mg by mouth 2 (two) times daily., Disp: , Rfl:  .  loratadine (CLARITIN) 10 MG tablet, Take 10 mg by mouth daily., Disp: , Rfl:  .  Omega-3 Fatty Acids (FISH OIL) 1000 MG CAPS, Take by mouth., Disp: , Rfl:  .  triamcinolone (NASACORT AQ) 55 MCG/ACT AERO nasal inhaler, Place 2 sprays into the nose daily., Disp: , Rfl:  .  DULoxetine (CYMBALTA) 30 MG capsule, Take 1 capsule (30 mg total) by mouth daily., Disp: 90 capsule, Rfl: 1 .  Magnesium Citrate 100 MG TABS, Take 1 tablet by mouth 2 (two) times daily. (Patient not taking: Reported on 05/13/2020), Disp: , Rfl:  .  Multiple Vitamins-Minerals (ONE-A-DAY 50 PLUS PO), Take by mouth. (Patient not taking: Reported on 05/13/2020), Disp: , Rfl:   Depression screen [facility] 2/9 05/13/2020 11/14/2019 10/21/2015  Decreased Interest - 3 1  Down, Depressed, Hopeless 1 2 1   PHQ - 2 Score 1 5 2   Altered sleeping - 2 2  Tired, decreased energy - 3 2  Change in appetite -  0 0  Feeling bad or failure about yourself  - 3 1  Trouble concentrating - 0 0  Moving slowly or fidgety/restless - 1 0  Suicidal thoughts - 2 0  PHQ-9 Score - 16 7  Difficult doing work/chores - Very difficult -    GAD 7 : Generalized Anxiety Score 05/13/2020 11/14/2019  Nervous, Anxious, on Edge 0 3  Control/stop worrying 1 3  Worry too much - different things 1 3  Trouble relaxing 1 2  Restless 0 0  Easily annoyed or irritable 2 3  Afraid - awful might happen 0 3  Total GAD 7 Score 5 17  Anxiety Difficulty Not difficult at all Very difficult     -------------------------------------------------------------------------- O: No physical exam performed due to remote telephone encounter.  Physical Exam: Patient remotely monitored without video.  Verbal communication appropriate.  Cognition normal.  No results found for this or any previous visit (from the past 2160 hour(s)).  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  Interested in another medication that may help with her symptoms of anxiety and depression without increasing her fatigue.  Discussed SNRIs such as pristiq, effexor and duloxetine.  Patient interested in duloxetine trial at 30mg  daily.  Discussed if after a few weeks would like to increase to 60mg  daily, that is ok to do, we would just have a call to discuss and to send in a sooner refill.  Plan: 1. STOP Fluoxetine 5mg  daily and BEGIN duloxetine 30mg  daily 2. RTC in 4 weeks, sooner, if needed      Relevant Medications   DULoxetine (CYMBALTA) 30 MG capsule   Anxiety    See depression A/P      Relevant Medications   DULoxetine (CYMBALTA) 30 MG capsule      Meds ordered this encounter  Medications  . DULoxetine (CYMBALTA) 30 MG capsule    Sig: Take 1 capsule (30 mg total) by mouth daily.    Dispense:  90 capsule    Refill:  1    Follow-up: - Return in 4 weeks for medication management follow up visit  Patient verbalizes understanding with the above medical recommendations including the limitation of remote medical advice.  Specific follow-up and call-back criteria were given for patient to follow-up or seek medical care more urgently if needed.  - Time spent in direct consultation with patient on phone: 7  minutes  , FNP-C [facility] 05/13/2020, 4:23 PM

## 2020-05-13 NOTE — Assessment & Plan Note (Signed)
See depression A/P. 

## 2020-05-15 ENCOUNTER — Telehealth: Payer: Self-pay

## 2020-05-15 NOTE — Telephone Encounter (Signed)
Copied from CRM [id number]. Topic: General - Other >> May 14, 2020  3:28 PM Tamela Oddi wrote: Reason for CRM: Patient would like the doctor to send a script for her DULoxetine (CYMBALTA) 30 MG capsule, but would like the 20 mg.  Instead of the 30 mg.  Please advise and call patient to let her know if this is possible at [phone]

## 2020-05-16 ENCOUNTER — Other Ambulatory Visit: Payer: Self-pay | Admitting: Family Medicine

## 2020-05-16 DIAGNOSIS — F419 Anxiety disorder, unspecified: Secondary | ICD-10-CM

## 2020-05-16 DIAGNOSIS — F331 Major depressive disorder, recurrent, moderate: Secondary | ICD-10-CM

## 2020-05-16 MED ORDER — DULOXETINE HCL 20 MG PO CPEP: 20.0000 mg | ORAL_CAPSULE | Freq: Every day | ORAL | 0 refills | Status: DC

## 2020-05-16 NOTE — Telephone Encounter (Signed)
Updated.

## 2020-07-03 ENCOUNTER — Telehealth: Payer: Self-pay

## 2020-07-03 ENCOUNTER — Telehealth: Payer: Self-pay | Admitting: Pharmacist

## 2020-07-03 NOTE — Chronic Care Management (AMB) (Signed)
  Chronic Care Management   Outreach Note  07/03/2020 Name: Karen Martinez MRN: [id number] DOB: 10-23-1965  Referred by: Tarri Fuller, FNP Reason for referral : No chief complaint on file.   Was unable to reach patient via telephone today and have left HIPAA compliant voicemail asking patient to return my call.    Follow Up Plan: Will collaborate with Care Guide to outreach to schedule follow up with me  Duanne Moron, PharmD, [facility] Clinical Pharmacist [facility] Care Management [phone]

## 2020-07-11 ENCOUNTER — Ambulatory Visit: Payer: 59 | Admitting: Family Medicine

## 2020-07-12 ENCOUNTER — Telehealth: Payer: Self-pay

## 2020-07-12 NOTE — Telephone Encounter (Signed)
Spoke with pt and she states that she is doing well and does not feel she needs to f/u at this time   [facility]

## 2020-07-12 NOTE — Chronic Care Management (AMB) (Signed)
  Care Management   Note  07/12/2020 Name: JANELLIE TENNISON MRN: [id number] DOB: [birth year]  MARYUM BATTERSON is a 54 y.o. year old female who is a primary care patient of Anitra Lauth, Jodelle Gross, FNP and is actively engaged with the care management team. I reached out to [place] [facility] by phone today to assist with re-scheduling a follow up visit with the Pharmacist  Follow up plan: Patient declines further follow up and engagement by the care management team. Appropriate care team members and provider have been notified via electronic communication.   Penne Lash, RMA Care Guide, Embedded Care Coordination [facility]  [place], [place] [address] Direct Dial: [phone] [email or web address]

## 2020-08-07 ENCOUNTER — Telehealth: Payer: Self-pay | Admitting: Family Medicine

## 2020-08-07 NOTE — Telephone Encounter (Signed)
Pt was last seen in sept 2021. Pt is on generic cymbalta and would like to stop the generic cymbalta 20 mg and have a rx sent to [facility] [address] . Pt would like to stop taking the cymbalta and try new rx plantinum immunocal powder for her anxiety/depression. Please advise

## 2020-08-08 NOTE — Telephone Encounter (Signed)
Needs OV for changes in medications

## 2020-08-08 NOTE — Telephone Encounter (Signed)
Called and told patient she needs to be seen in order to discuss changing meds. She said she wanted to be seen today.Explained there was no availability today but was able to schedule her tomorrow afternoon. She verbalized understanding of appt time tomorrow.

## 2020-08-09 ENCOUNTER — Ambulatory Visit (INDEPENDENT_AMBULATORY_CARE_PROVIDER_SITE_OTHER): Payer: 59 | Admitting: Family Medicine

## 2020-08-09 ENCOUNTER — Other Ambulatory Visit: Payer: Self-pay

## 2020-08-09 ENCOUNTER — Encounter: Payer: Self-pay | Admitting: Family Medicine

## 2020-08-09 VITALS — BP 140/69 | HR 65 | Temp 97.5°F | Resp 17 | Ht 63.0 in | Wt 158.6 lb

## 2020-08-09 DIAGNOSIS — B354 Tinea corporis: Secondary | ICD-10-CM | POA: Insufficient documentation

## 2020-08-09 DIAGNOSIS — L659 Nonscarring hair loss, unspecified: Secondary | ICD-10-CM

## 2020-08-09 MED ORDER — FLUCONAZOLE 150 MG PO TABS: ORAL_TABLET | ORAL | 0 refills | Status: DC

## 2020-08-09 NOTE — Assessment & Plan Note (Signed)
Reported hair thinning since June/July 2021.  Had labs for thyroid hormone 12/2019, and unknown if had previous COVID or other viral infection preceding hair thinning.  Reports does have many ceramic cups/mugs, discussed can test for heavy metals as coatings on mugs are not regulated in the [place] when it comes to lead/mercury.  Will have TSH and heavy metal labs drawn.  Discussed it labs return WNL, will discuss referral to dermatology.  Patient in agreement with plan.

## 2020-08-09 NOTE — Assessment & Plan Note (Signed)
Rash with itching on groin, has had an increase in itching without vaginal discharge/odor.  Has been using topical antifungal without improvement.  Will treat with diflucan 150mg  x 1 today and repeat dosing in 72 hours.

## 2020-08-09 NOTE — Progress Notes (Signed)
Subjective:    Patient ID: Karen Martinez, female    DOB: December 27, 1965, 54 y.o.   MRN: [id number]  Karen Martinez is a 54 y.o. female presenting on 08/09/2020 for Alopecia ( Pt complain of hair thinning x 6 mths. Pt unsure if it is associated w/ her medication or menopause's. ) and Urticaria (Pt complains of vaginal itching x 3 days and now she have developed a rash pelvic area and inner thigh. She does admit that she recently bought new underwear. She been treating the itching with antifungal medication, peroxides.)   HPI  Karen Martinez presents to clinic for concerns of hair thinning and groin itching.  Reports that she has noticed hair thinning since June/July 2021 that has continued progressively for the past 6 months.  Reports family history of hair thinning in her female relatives.    Has concerns for groin itching, has been applying topical antifungal cream without improvement in symptoms.  Has recently purchased new underwear but has not seen change with this.  Denies any vaginal discharge, internal vaginal itching or odor.  Depression screen [facility] 2/9 05/13/2020 11/14/2019 10/21/2015  Decreased Interest - 3 1  Down, Depressed, Hopeless 1 2 1   PHQ - 2 Score 1 5 2   Altered sleeping - 2 2  Tired, decreased energy - 3 2  Change in appetite - 0 0  Feeling bad or failure about yourself  - 3 1  Trouble concentrating - 0 0  Moving slowly or fidgety/restless - 1 0  Suicidal thoughts - 2 0  PHQ-9 Score - 16 7  Difficult doing work/chores - Very difficult -    Social History   Tobacco Use  . Smoking status: Never Smoker  . Smokeless tobacco: Never Used  Substance Use Topics  . Alcohol use: Yes    Alcohol/week: 1.0 standard drink    Types: 1 Glasses of wine per week    Comment: 5 glasses wine a week   . Drug use: No    Review of Systems  Constitutional: Negative.   HENT: Negative.   Eyes: Negative.   Respiratory: Negative.   Cardiovascular: Negative.   Gastrointestinal: Negative.    Endocrine: Negative.   Genitourinary: Negative.        Groin itching  Musculoskeletal: Negative.   Skin: Negative.        Hair thinning  Allergic/Immunologic: Negative.   Neurological: Negative.   Hematological: Negative.   Psychiatric/Behavioral: Negative.    Per HPI unless specifically indicated above     Objective:    BP 140/69 (BP Location: Right Arm, Patient Position: Sitting, Cuff Size: Normal)   Pulse 65   Temp (!) 97.5 F (36.4 C) (Temporal)   Resp 17   Ht 5\' 3"  (1.6 m)   Wt 158 lb 9.6 oz (71.9 kg)   LMP 01/15/2018   SpO2 100%   BMI 28.09 kg/m   Wt Readings from Last 3 Encounters:  08/09/20 158 lb 9.6 oz (71.9 kg)  01/16/20 155 lb (70.3 kg)  12/14/19 154 lb 6.4 oz (70 kg)    Physical Exam Vitals and nursing note reviewed.  Constitutional:      General: She is not in acute distress.    Appearance: Normal appearance. She is well-developed and well-groomed. She is not ill-appearing or toxic-appearing.  HENT:     Head: Normocephalic and atraumatic.     Nose:     Comments: 14/10/21 is in place, covering mouth and nose. Eyes:     General:  Lids are normal. Vision grossly intact.        Right eye: No discharge.        Left eye: No discharge.     Extraocular Movements: Extraocular movements intact.     Conjunctiva/sclera: Conjunctivae normal.     Pupils: Pupils are equal, round, and reactive to light.  Cardiovascular:     Rate and Rhythm: Normal rate and regular rhythm.     Pulses: Normal pulses.     Heart sounds: Normal heart sounds. No murmur heard. No friction rub. No gallop.   Pulmonary:     Effort: Pulmonary effort is normal. No respiratory distress.     Breath sounds: Normal breath sounds.  Skin:    General: Skin is warm and dry.     Capillary Refill: Capillary refill takes less than 2 seconds.     Findings: Rash present.     Comments: No areas of hair pulling/damage at scalp noted  Neurological:     General: No focal deficit present.     Mental  Status: She is alert and oriented to person, place, and time.  Psychiatric:        Attention and Perception: Attention and perception normal.        Mood and Affect: Mood and affect normal.        Speech: Speech normal.        Behavior: Behavior normal. Behavior is cooperative.        Thought Content: Thought content normal.        Cognition and Memory: Cognition and memory normal.        Judgment: Judgment normal.    Results for orders placed or performed in visit on 01/16/20  Cytology - PAP  Result Value Ref Range   Adequacy      Satisfactory for evaluation; transformation zone component PRESENT.   Diagnosis      - Negative for intraepithelial lesion or malignancy (NILM)      Assessment & Plan:   Problem List Items Addressed This Visit      Musculoskeletal and Integument   Tinea corporis    Rash with itching on groin, has had an increase in itching without vaginal discharge/odor.  Has been using topical antifungal without improvement.  Will treat with diflucan 150mg  x 1 today and repeat dosing in 72 hours.      Relevant Medications   fluconazole (DIFLUCAN) 150 MG tablet     Other   Hair loss - Primary    Reported hair thinning since June/July 2021.  Had labs for thyroid hormone 12/2019, and unknown if had previous COVID or other viral infection preceding hair thinning.  Reports does have many ceramic cups/mugs, discussed can test for heavy metals as coatings on mugs are not regulated in the 01/2020 when it comes to lead/mercury.  Will have TSH and heavy metal labs drawn.  Discussed it labs return WNL, will discuss referral to dermatology.  Patient in agreement with plan.      Relevant Orders   TSH + free T4   Heavy Metals Panel, Blood      Meds ordered this encounter  Medications  . fluconazole (DIFLUCAN) 150 MG tablet    Sig: Take 1 tablet today and repeat dosing in 72 hours    Dispense:  2 tablet    Refill:  0   Follow up plan: Return if symptoms worsen or fail to  improve.   [place], FNP Family Nurse Practitioner [facility]  [facility] 08/09/2020, 4:14 PM

## 2020-08-09 NOTE — Patient Instructions (Signed)
Have your labs drawn in the next 1-2 weeks  I have sent in a prescription for diflucan 150mg  to take 1 tablet now and repeat again in 72 hours  If your labs result as within normal limits, will refer to dermatology for your concerns of hair loss  We will plan to see you back if your symptoms worsen or fail to improve  You will receive a survey after today's visit either digitally by e-mail or paper by USPS mail. Your experiences and feedback matter to .  Please respond so we know how we are doing as we provide care for you.  Call us with any questions/concerns/needs.  It is my goal to be available to you for your health concerns.  Thanks for choosing me to be a partner in your healthcare needs!  [place], FNP-C Family Nurse Practitioner [facility] Phone: [phone]

## 2020-08-12 ENCOUNTER — Other Ambulatory Visit: Payer: Self-pay

## 2020-08-12 ENCOUNTER — Other Ambulatory Visit: Payer: 59

## 2020-08-15 LAB — HEAVY METALS PANEL, BLOOD
Arsenic: <10 mcg/L (ref <23)
Lead: <1 ug/dL (ref <5)
Mercury, B: <5 mcg/L (ref 0–10)

## 2020-08-15 LAB — TSH+FREE T4: TSH W/REFLEX TO FT4: 2.72 mIU/L

## 2020-08-16 ENCOUNTER — Other Ambulatory Visit: Payer: Self-pay | Admitting: Family Medicine

## 2020-08-16 DIAGNOSIS — L659 Nonscarring hair loss, unspecified: Secondary | ICD-10-CM

## 2020-08-19 ENCOUNTER — Encounter: Payer: Self-pay | Admitting: Family Medicine

## 2020-08-19 NOTE — Telephone Encounter (Signed)
Copied from CRM [id number]. Topic: Quick Communication - Rx Refill/Question >> Aug 19, 2020  9:20 AM Jaquita Rector A wrote: Medication: fluconazole (DIFLUCAN) 150 MG tablet   Has the patient contacted their pharmacy? Yes.   (Agent: If no, request that the patient contact the pharmacy for the refill.) (Agent: If yes, when and what did the pharmacy advise?)  Preferred Pharmacy (with phone number or street name): [facility] [address] - [place], [place] - [address]  Phone:  [phone] Fax:  [phone]     Agent: Please be advised that RX refills may take up to 3 business days. We ask that you follow-up with your pharmacy.

## 2020-08-19 NOTE — Telephone Encounter (Signed)
Not a routine med to be refilled.  If she's having any concerns, we can do a virtual OV

## 2020-08-19 NOTE — Telephone Encounter (Signed)
Medication not on current med list.  

## 2020-08-19 NOTE — Telephone Encounter (Signed)
The pt took the two rounds of diflucan and notice some improvement, but then it flared up again x 3 days ago.. She describe it as hives, itching, redness and irritated. She is concern it could be an reaction to one of her medication. Requesting a refill of diflucan. Please advise

## 2020-08-20 NOTE — Telephone Encounter (Signed)
I contacted the patient and she informed me that she have an dermatology appointment today. She said she will show her dermatologist the rash at her appt. She will follow back up with [place] if the dermatologist is unable to address.

## 2020-08-20 NOTE — Telephone Encounter (Signed)
Would need further assessment, esp if concerned could be a med reaction.  Can schedule virtual or in office

## 2020-09-02 ENCOUNTER — Ambulatory Visit: Payer: Self-pay

## 2020-09-02 NOTE — Telephone Encounter (Signed)
This encounter was created in error - please disregard.

## 2020-09-02 NOTE — Telephone Encounter (Signed)
Patient called and says she's been having dizziness since 08/04/20. She says she thought it was related to her drinking alcohol while taking Cymbalta, which only happened twice. She says the first time she drank alcohol, she was nauseated and vomiting, but the 2nd time she drank alcohol, nothing happened. She says the dizziness is progressing and getting worse. She says she turns her head and she gets woozy headed, otherwise no dizziness when sitting, only when up moving around. She says it's affecting her working. She says she has a pain to the back of her right ear and wonders if she has inner ear, she says she needs someone to look in her ear. She says she's nauseated if she keeps moving around, otherwise no nausea and no vomiting. I called the office and spoke with Rachell, FC who asked to speak to the patient to schedule an appointment, the call was connected successfully.  Reason for Disposition . [1] MODERATE dizziness (e.g., interferes with normal activities) AND [2] has NOT been evaluated by physician for this  (Exception: dizziness caused by heat exposure, sudden standing, or poor fluid intake)  Answer Assessment - Initial Assessment Questions 1. DESCRIPTION: "Describe your dizziness."     Woozy headed  2. LIGHTHEADED: "Do you feel lightheaded?" (e.g., somewhat faint, woozy, weak upon standing)     Yes 3. VERTIGO: "Do you feel like either you or the room is spinning or tilting?" (i.e. vertigo)     No 4. SEVERITY: "How bad is it?"  "Do you feel like you are going to faint?" "Can you stand and walk?"   - MILD: Feels slightly dizzy, but walking normally.   - MODERATE: Feels very unsteady when walking, but not falling; interferes with normal activities (e.g., school, work) .   - SEVERE: Unable to walk without falling, or requires assistance to walk without falling; feels like passing out now.      Moderate 5. ONSET:  "When did the dizziness begin?"     08/04/20 6. AGGRAVATING FACTORS: "Does  anything make it worse?" (e.g., standing, change in head position)     Change head position, standing, walking 7. HEART RATE: "Can you tell me your heart rate?" "How many beats in 15 seconds?"  (Note: not all patients can do this)       N/A 8. CAUSE: "What do you think is causing the dizziness?"     I thought it was mixing alcohol with Cymbalta, but this is happening more 9. RECURRENT SYMPTOM: "Have you had dizziness before?" If Yes, ask: "When was the last time?" "What happened that time?"     Vertigo before, but not like this 10. OTHER SYMPTOMS: "Do you have any other symptoms?" (e.g., fever, chest pain, vomiting, diarrhea, bleeding)       Headache on back side of head on the right, nausea if keep moving 11. PREGNANCY: "Is there any chance you are pregnant?" "When was your last menstrual period?"       No  Protocols used: DIZZINESS [facility]

## 2020-09-03 ENCOUNTER — Ambulatory Visit: Payer: 59 | Admitting: Family Medicine

## 2020-09-03 ENCOUNTER — Other Ambulatory Visit: Payer: Self-pay

## 2020-09-03 ENCOUNTER — Encounter: Payer: Self-pay | Admitting: Family Medicine

## 2020-09-03 VITALS — BP 146/77 | HR 69 | Temp 97.5°F | Resp 16 | Ht 63.0 in | Wt 160.4 lb

## 2020-09-03 DIAGNOSIS — R42 Dizziness and giddiness: Secondary | ICD-10-CM

## 2020-09-03 DIAGNOSIS — F331 Major depressive disorder, recurrent, moderate: Secondary | ICD-10-CM

## 2020-09-03 DIAGNOSIS — H6981 Other specified disorders of Eustachian tube, right ear: Secondary | ICD-10-CM | POA: Diagnosis not present

## 2020-09-03 MED ORDER — MECLIZINE HCL 25 MG PO TABS
12.5000 mg | ORAL_TABLET | Freq: Three times a day (TID) | ORAL | 2 refills | Status: DC | PRN
Start: 2020-09-03 — End: 2021-04-14

## 2020-09-03 MED ORDER — AZITHROMYCIN 250 MG PO TABS: ORAL_TABLET | ORAL | 0 refills | Status: DC

## 2020-09-03 NOTE — Patient Instructions (Addendum)
Thank you for coming to the office today.  1. You have symptoms of Vertigo (Benign Paroxysmal Positional Vertigo) - This is commonly caused by inner ear fluid imbalance, sometimes can be worsened by allergies and sinus symptoms, otherwise it can occur randomly sometimes and we may never discover the exact cause. - To treat this, try the Epley Manuever (see diagrams/instructions below) at home up to 3 times a day for 1-2 weeks or until symptoms resolve - You may take Meclizine as needed up to 3 times a day for dizziness, this will not cure symptoms but may help. Caution may make you drowsy.  If you develop significant worsening episode with vertigo that does not improve and you get severe headache, loss of vision, arm or leg weakness, slurred speech, or other concerning symptoms please seek immediate medical attention at Emergency Department.  Please schedule a follow-up appointment with Dr Althea Charon within 4 weeks if Vertigo not improving, and will consider Referral to Vestibular Rehab  See the next page for images describing the Epley Manuever.     ----------------------------------------------------------------------------------------------------------------------          Please schedule a Follow-up Appointment to: Return in about 4 weeks (around 10/01/2020), or if symptoms worsen or fail to improve, for 2-4 weeks if vertigo dizziness not improved.  If you have any other questions or concerns, please feel free to call the office or send a message through MyChart. You may also schedule an earlier appointment if necessary.  Additionally, you may be receiving a survey about your experience at our office within a few days to 1 week by e-mail or mail. We value your feedback.  Saralyn Pilar, DO [facility], [place]

## 2020-09-03 NOTE — Assessment & Plan Note (Signed)
Mood is controlled currently on SNRI Recurrent depression On Duloxetine 30mg  daily, continue now - discussed that possible side effect dizziness, see A&P future may adjust

## 2020-09-03 NOTE — Progress Notes (Signed)
Subjective:    Patient ID: Karen Martinez, female    DOB: [birth year], 55 y.o.   MRN: [id number]  Karen Martinez is a 55 y.o. female presenting on 09/03/2020 for Dizziness (Onset month --but from past 2 weeks getting intermittent worst --nausea )  PCP is Danielle Rankin, FNP   HPI   Dizziness / Nausea Vomiting Reports sudden onset motion sickness dizziness onset 1 month ago, she was riding in car, she was not having vomiting but had some nausea. She did take some significant amount of wine the night before and thought medication reaction. She has persistent dizziness for past month, and often will have persistent mild dizziness symptoms every day without relief, it can be mild and then can have severe waves of worsening dizziness, with severe symptoms for a whole day or more, and then can lessen over few days. She describes motion sickness symptoms with movement and vertigo. - She takes Duloxetine 30mg  daily now, it was originally on 20mg  dosage back in 05/2020. She was increased to 30mg  in past 1-2 months unsure timeline precisely but thinks this may contribute. - She has missed a dose of Duloxetine in past and she had worse dizziness. - History of Vertigo in past  Admits hot flash symptoms at times. She is post menopausal, last menstrual cycle 3 years ago.  Denies numbness weakness tingling headache  Major Depression Recurrent, moderate Previously managed by PCP with SSRi in past Fluoxetine, has now been on Duloxetine. See above note for discussion on dosing of duloxetine. Mood remains stable to improved overall.   Depression screen [facility] 2/9 09/03/2020 05/13/2020 11/14/2019  Decreased Interest 2 - 3  Down, Depressed, Hopeless 1 1 2   PHQ - 2 Score 3 1 5   Altered sleeping 0 - 2  Tired, decreased energy 3 - 3  Change in appetite 1 - 0  Feeling bad or failure about yourself  1 - 3  Trouble concentrating 0 - 0  Moving slowly or fidgety/restless 0 - 1  Suicidal thoughts 0 - 2  PHQ-9 Score 8 - 16   Difficult doing work/chores Somewhat difficult - Very difficult   GAD 7 : Generalized Anxiety Score 09/03/2020 05/13/2020 11/14/2019  Nervous, Anxious, on Edge 2 0 3  Control/stop worrying 2 1 3   Worry too much - different things 2 1 3   Trouble relaxing 1 1 2   Restless 0 0 0  Easily annoyed or irritable 1 2 3   Afraid - awful might happen 0 0 3  Total GAD 7 Score 8 5 17   Anxiety Difficulty Somewhat difficult Not difficult at all Very difficult      Social History   Tobacco Use  . Smoking status: Never Smoker  . Smokeless tobacco: Never Used  Substance Use Topics  . Alcohol use: Yes    Alcohol/week: 1.0 standard drink    Types: 1 Glasses of wine per week    Comment: 5 glasses wine a week   . Drug use: No    Review of Systems Per HPI unless specifically indicated above     Objective:    BP (!) 146/77   Pulse 69   Temp (!) 97.5 F (36.4 C) (Temporal)   Resp 16   Ht 5\' 3"  (1.6 m)   Wt 160 lb 6.4 oz (72.8 kg)   LMP 01/15/2018   SpO2 99%   BMI 28.41 kg/m   Wt Readings from Last 3 Encounters:  09/03/20 160 lb 6.4 oz (72.8 kg)  08/09/20  158 lb 9.6 oz (71.9 kg)  01/16/20 155 lb (70.3 kg)    Physical Exam Vitals and nursing note reviewed.  Constitutional:      General: She is not in acute distress.    Appearance: She is well-developed and well-nourished. She is not diaphoretic.     Comments: Well-appearing, comfortable, cooperative  HENT:     Head: Normocephalic and atraumatic.     Right Ear: Tympanic membrane, ear canal and external ear normal. There is no impacted cerumen.     Left Ear: Tympanic membrane, ear canal and external ear normal. There is no impacted cerumen.     Ears:     Comments: Mild R sided inner ear effusion    Mouth/Throat:     Mouth: Oropharynx is clear and moist.  Eyes:     General:        Right eye: No discharge.        Left eye: No discharge.     Conjunctiva/sclera: Conjunctivae normal.  Neck:     Thyroid: No thyromegaly.   Cardiovascular:     Rate and Rhythm: Normal rate and regular rhythm.     Pulses: Intact distal pulses.     Heart sounds: Normal heart sounds. No murmur heard.   Pulmonary:     Effort: Pulmonary effort is normal. No respiratory distress.     Breath sounds: Normal breath sounds. No wheezing or rales.  Musculoskeletal:        General: No edema. Normal range of motion.     Cervical back: Normal range of motion and neck supple.  Lymphadenopathy:     Cervical: No cervical adenopathy.  Skin:    General: Skin is warm and dry.     Findings: No erythema or rash.  Neurological:     General: No focal deficit present.     Mental Status: She is alert and oriented to person, place, and time. Mental status is at baseline.     Cranial Nerves: No cranial nerve deficit.     Sensory: No sensory deficit.  Psychiatric:        Mood and Affect: Mood and affect normal.        Behavior: Behavior normal.     Comments: Well groomed, good eye contact, normal speech and thoughts       Results for orders placed or performed in visit on 08/09/20  TSH + free T4  Result Value Ref Range   TSH W/REFLEX TO FT4 2.72 mIU/L  Heavy Metals Panel, Blood  Result Value Ref Range   Arsenic <10 <23 mcg/L   Lead <1 <5 mcg/dL   Mercury, B <5 0 - 10 mcg/L      Assessment & Plan:   Problem List Items Addressed This Visit    Moderate recurrent major depression (HCC)    Mood is controlled currently on SNRI Recurrent depression On Duloxetine 30mg  daily, continue now - discussed that possible side effect dizziness, see A&P future may adjust       Other Visit Diagnoses    Vertigo    -  Primary   Relevant Medications   meclizine (ANTIVERT) 25 MG tablet   Dizziness       Relevant Medications   meclizine (ANTIVERT) 25 MG tablet   Eustachian tube dysfunction, right       Relevant Medications   azithromycin (ZITHROMAX Z-PAK) 250 MG tablet      #Vertigo vs Dizziness Chronic problem now for past >4  weeks. Clinically seems persistent but  has specific motion sickness worsening with position and movements more suggestive of vertigo No focal neuro deficits  Plan: 1. Handout given with Epley maneuver TID for 1-2 weeks until resolved 2. Rx meclizine PRN for breakthrough symptoms 3. Return criteria, if not improved consider vestibular PT referral   #Eustachian Tube Dysfunction Question if sinus or inner ear component affecting her vertigo, see above A&P Exam without obvious AOM at this time. However she has had very similar episode before caused by ear infection, she has taken zpak in past. Now given duration >1 month, discussed this as a back up option only if not improving with vertigo treatment she may consider taking the Azithromycin Zpak for possible sinus/inner ear cause of symptoms. - Restart Nasocort nasal steroid  Meds ordered this encounter  Medications  . meclizine (ANTIVERT) 25 MG tablet    Sig: Take 0.5-1 tablets (12.5-25 mg total) by mouth 3 (three) times daily as needed for dizziness.    Dispense:  60 tablet    Refill:  2  . azithromycin (ZITHROMAX Z-PAK) 250 MG tablet    Sig: Take 2 tabs (500mg  total) on Day 1. Take 1 tab (250mg ) daily for next 4 days.    Dispense:  6 tablet    Refill:  0      Follow up plan: Return in about 4 weeks (around 10/01/2020), or if symptoms worsen or fail to improve, for 2-4 weeks if vertigo dizziness not improved.   Nobie Putnam, [facility] 09/03/2020, 11:24 AM

## 2020-12-02 ENCOUNTER — Telehealth: Payer: 59

## 2020-12-02 ENCOUNTER — Telehealth: Payer: Self-pay

## 2020-12-02 NOTE — Telephone Encounter (Signed)
Contacted patient to discuss scheduling her screening colonoscopy.  Pt is uneasy about scheduling.  I've informed her that she can go to the [facility] website to read about our physicians and pt reviews.  She stated that she has a bulge in her left side, and her gynecologist said it was fat.  She also said she has IBS.  I suggested that we could schedule an office visit prior to scheduling her colonoscopy if she would like to do so.  She has declined to schedule either at this time, but will think about it some more and call back if she decides to have it done.  Thanks  [place], [place]

## 2021-01-07 ENCOUNTER — Telehealth: Payer: Self-pay | Admitting: Internal Medicine

## 2021-01-07 NOTE — Telephone Encounter (Signed)
Requested medication (s) are due for refill today: Amount not specified  Requested medication (s) are on the active medication list: yes  Last refill: 08/09/20  Amount not specified  Future visit scheduled no  Notes to clinic: Historical provider  Requested Prescriptions  Pending Prescriptions Disp Refills   DULoxetine (CYMBALTA) 30 MG capsule      Sig: Take 1 capsule (30 mg total) by mouth daily.      Psychiatry: Antidepressants - SNRI Failed - 01/07/2021  4:28 PM      Failed - Last BP in normal range    BP Readings from Last 1 Encounters:  09/03/20 (!) 146/77          Passed - Completed PHQ-2 or PHQ-9 in the last 360 days      Passed - Valid encounter within last 6 months    Recent Outpatient Visits           4 months ago Vertigo   [facility] [place], Netta Neat, DO   5 months ago Hair loss   [facility], Jodelle Gross, FNP   7 months ago Moderate recurrent major depression [facility])   [facility], Jodelle Gross, FNP   11 months ago Cervical cancer screening   [facility], Jodelle Gross, FNP   1 year ago Back pain with right-sided radiculopathy   [facility], Jodelle Gross, FNP

## 2021-01-07 NOTE — Telephone Encounter (Signed)
Medication Refill - Medication: Cymbalta   Has the patient contacted their pharmacy? Yes.   Pt states that the pharmacy has reached out and received no response. Pt is requesting to have this sent in as she only has one more pill left for tomorrow. Please advise.  (Agent: If no, request that the patient contact the pharmacy for the refill.) (Agent: If yes, when and what did the pharmacy advise?)  Preferred Pharmacy (with phone number or street name):  [facility] [address] - [place], [place] - [address] [facility]  [address] [place] [address]  Phone: [phone] Fax: [phone]  Hours: Not open 24 hours    Agent: Please be advised that RX refills may take up to 3 business days. We ask that you follow-up with your pharmacy.

## 2021-01-08 ENCOUNTER — Other Ambulatory Visit: Payer: Self-pay

## 2021-01-08 MED ORDER — DULOXETINE HCL 30 MG PO CPEP: 30.0000 mg | ORAL_CAPSULE | Freq: Every day | ORAL | 3 refills | Status: DC

## 2021-01-08 NOTE — Telephone Encounter (Signed)
It looks like this was filled by Dr. Althea Charon today

## 2021-02-14 ENCOUNTER — Telehealth: Payer: Self-pay

## 2021-02-14 NOTE — Telephone Encounter (Signed)
Copied from CRM [id number]. Topic: General - Other >> Feb 14, 2021 10:03 AM Tamela Oddi wrote: Reason for CRM: Patient would like the nurse to call her because she stated she was exposed to either poison oak/ivy and is itching bad.  She really does not want an appt. But would like to know if she could get some medication called in.  Please advise and call to discuss at [phone]

## 2021-02-14 NOTE — Telephone Encounter (Signed)
Copied from CRM [id number]. Topic: General - Other >> Feb 14, 2021 10:03 AM Karen Martinez wrote: Reason for CRM: Patient would like the nurse to call her because she stated she was exposed to either poison oak/ivy and is itching bad.  She really does not want an appt. But would like to know if she could get some medication called in.  Please advise and call to discuss at [phone]   Attempted to contact the patient to inform her that she needs an appt to address her concerns. If she doesn't wish to make an appt she can try OTC medications like Calamine Lotion and Benadryl to help with the itching.

## 2021-04-14 ENCOUNTER — Other Ambulatory Visit: Payer: Self-pay

## 2021-04-14 ENCOUNTER — Encounter: Payer: Self-pay | Admitting: Internal Medicine

## 2021-04-14 ENCOUNTER — Ambulatory Visit (INDEPENDENT_AMBULATORY_CARE_PROVIDER_SITE_OTHER): Payer: 59 | Admitting: Internal Medicine

## 2021-04-14 VITALS — BP 129/63 | HR 76 | Temp 98.0°F | Resp 17 | Ht 63.0 in | Wt 160.0 lb

## 2021-04-14 DIAGNOSIS — J4541 Moderate persistent asthma with (acute) exacerbation: Secondary | ICD-10-CM

## 2021-04-14 DIAGNOSIS — M19041 Primary osteoarthritis, right hand: Secondary | ICD-10-CM | POA: Diagnosis not present

## 2021-04-14 DIAGNOSIS — F419 Anxiety disorder, unspecified: Secondary | ICD-10-CM

## 2021-04-14 DIAGNOSIS — Z6828 Body mass index (BMI) 28.0-28.9, adult: Secondary | ICD-10-CM

## 2021-04-14 DIAGNOSIS — M19042 Primary osteoarthritis, left hand: Secondary | ICD-10-CM

## 2021-04-14 DIAGNOSIS — F32A Depression, unspecified: Secondary | ICD-10-CM

## 2021-04-14 DIAGNOSIS — E663 Overweight: Secondary | ICD-10-CM | POA: Insufficient documentation

## 2021-04-14 DIAGNOSIS — E782 Mixed hyperlipidemia: Secondary | ICD-10-CM

## 2021-04-14 LAB — LIPID PANEL
Cholesterol: 224 mg/dL — ABNORMAL HIGH (ref <200)
HDL: 43 mg/dL — ABNORMAL LOW (ref >=50)
LDL Cholesterol (Calc): 130 mg/dL (calc) — ABNORMAL HIGH
Non-HDL Cholesterol (Calc): 181 mg/dL (calc) — ABNORMAL HIGH (ref <130)
Total CHOL/HDL Ratio: 5.2 (calc) — ABNORMAL HIGH (ref <5.0)
Triglycerides: 354 mg/dL — ABNORMAL HIGH (ref <150)

## 2021-04-14 NOTE — Assessment & Plan Note (Signed)
Encourage diet and exercise for weight loss 

## 2021-04-14 NOTE — Assessment & Plan Note (Signed)
Stable on Duloxetine Support offered 

## 2021-04-14 NOTE — Progress Notes (Signed)
Subjective:    Patient ID: Karen Martinez, female    DOB: [birth year], 55 y.o.   MRN: [id number]  HPI  Patient presents the clinic today for follow-up of chronic conditions.  Anxiety and Depression: Chronic, managed on Duloxetine.  She is not currently seeing a therapist.  She denies SI/HI.  Asthma: Mild, persistent.  She uses Albuterol only as needed.  There are no PFTs on file.  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  Her pain is managed with Duloxetine.  She does not follow with orthopedics.  HLD: Her last LDL was 170, triglycerides 169, 12/2019.  She is taking Fish Oil OTC.  She tries to consume a low-fat diet.  Review of Systems     Past Medical History:  Diagnosis Date   Allergy    Asthma    Depression    Emphysema of lung (HCC)    Heart murmur    heart arrhythmias   Hyperlipidemia    Thyroid disease    Urine incontinence     Current Outpatient Medications  Medication Sig Dispense Refill   albuterol (VENTOLIN HFA) 108 (90 Base) MCG/ACT inhaler Inhale 2 puffs into the lungs every 6 (six) hours as needed for wheezing or shortness of breath. 6.7 g 1   azithromycin (ZITHROMAX Z-PAK) 250 MG tablet Take 2 tabs (500mg  total) on Day 1. Take 1 tab (250mg ) daily for next 4 days. 6 tablet 0   CINNAMON PO Take 1,200 mg by mouth 2 (two) times daily.     DULoxetine (CYMBALTA) 30 MG capsule Take 1 capsule (30 mg total) by mouth daily. 30 capsule 3   loratadine (CLARITIN) 10 MG tablet Take 10 mg by mouth daily.     Magnesium Citrate 100 MG TABS Take 1 tablet by mouth 2 (two) times daily.     meclizine (ANTIVERT) 25 MG tablet Take 0.5-1 tablets (12.5-25 mg total) by mouth 3 (three) times daily as needed for dizziness. 60 tablet 2   Multiple Vitamins-Minerals (ONE-A-DAY 50 PLUS PO) Take by mouth.     Omega-3 Fatty Acids (FISH OIL) 1000 MG CAPS Take by mouth.     triamcinolone (NASACORT) 55 MCG/ACT AERO nasal inhaler Place 2 sprays into the nose daily.     No current facility-administered  medications for this visit.    Allergies  Allergen Reactions   Keflex [Cephalexin] Hives   Poison Oak Extract [Poison Oak Extract]    Sulfa Antibiotics     Other reaction(s): Other (See Comments) Other Reaction: Possible rash at end of course    Family History  Problem Relation Age of Onset   Stroke Father    Heart disease Father    Cancer Father        facial    Hyperlipidemia Father    Cancer Maternal Grandfather    Heart disease Maternal Grandfather    Alcohol abuse Maternal Grandfather    Alcohol abuse Paternal Grandfather     Social History   Socioeconomic History   Marital status: Married    Spouse name: Not on file   Number of children: Not on file   Years of education: Not on file   Highest education level: Not on file  Occupational History   Not on file  Tobacco Use   Smoking status: Never   Smokeless tobacco: Never  Substance and Sexual Activity   Alcohol use: Yes    Alcohol/week: 1.0 standard drink    Types: 1 Glasses of wine per week    Comment:  5 glasses wine a week    Drug use: No   Sexual activity: Not on file  Other Topics Concern   Not on file  Social History Narrative   Not on file   Social Determinants of Health   Financial Resource Strain: Not on file  Food Insecurity: Not on file  Transportation Needs: Not on file  Physical Activity: Not on file  Stress: Not on file  Social Connections: Not on file  Intimate Partner Violence: Not on file     Constitutional: Denies fever, malaise, fatigue, headache or abrupt weight changes.  HEENT: Denies eye pain, eye redness, ear pain, ringing in the ears, wax buildup, runny nose, nasal congestion, bloody nose, or sore throat. Respiratory: Denies difficulty breathing, shortness of breath, cough or sputum production.   Cardiovascular: Denies chest pain, chest tightness, palpitations or swelling in the hands or feet.  Gastrointestinal: Denies abdominal pain, bloating, constipation, diarrhea or  blood in the stool.  GU: Pt reports decreased libido. Denies urgency, frequency, pain with urination, burning sensation, blood in urine, odor or discharge. Musculoskeletal: Patient reports joint pain in hands.  Denies decrease in range of motion, difficulty with gait, muscle pain or joint swelling.  Skin: Denies redness, rashes, lesions or ulcercations.  Neurological: Denies dizziness, difficulty with memory, difficulty with speech or problems with balance and coordination.  Psych: Patient has a history of anxiety and depression.  Denies SI/HI.  No other specific complaints in a complete review of systems (except as listed in HPI above).  Objective:   Physical Exam BP 129/63 (BP Location: Right Arm, Patient Position: Sitting, Cuff Size: Normal)   Pulse 76   Temp 98 F (36.7 C) (Temporal)   Resp 17   Ht 5\' 3"  (1.6 m)   Wt 160 lb (72.6 kg)   LMP 01/15/2018   SpO2 100%   BMI 28.34 kg/m   Wt Readings from Last 3 Encounters:  09/03/20 160 lb 6.4 oz (72.8 kg)  08/09/20 158 lb 9.6 oz (71.9 kg)  01/16/20 155 lb (70.3 kg)    General: Appears her stated age, overweight, in NAD. Skin: Warm, dry and intact.  HEENT: Head: normal shape and size; Eyes: sclera white and EOMs intact;  Cardiovascular: Normal rate and rhythm. S1,S2 noted.  No murmur, rubs or gallops noted.  Pulmonary/Chest: Normal effort and positive vesicular breath sounds. No respiratory distress. No wheezes, rales or ronchi noted.  Musculoskeletal: No difficulty with gait.  Neurological: Alert and oriented. Psychiatric: Mood and affect normal. Behavior is normal. Judgment and thought content normal.     BMET    Component Value Date/Time   NA 140 01/02/2020 0828   NA 140 08/20/2015 1019   K 4.1 01/02/2020 0828   CL 104 01/02/2020 0828   CO2 26 01/02/2020 0828   GLUCOSE 86 01/02/2020 0828   BUN 8 01/02/2020 0828   BUN 9 08/20/2015 1019   CREATININE 0.69 01/02/2020 0828   CALCIUM 9.7 01/02/2020 0828   GFRNONAA >89  05/25/2016 1158   GFRAA >89 05/25/2016 1158    Lipid Panel     Component Value Date/Time   CHOL 245 (H) 01/02/2020 0828   CHOL 213 (H) 08/20/2015 1019   TRIG 169 (H) 01/02/2020 0828   HDL 43 (L) 01/02/2020 0828   HDL 47 08/20/2015 1019   CHOLHDL 5.7 (H) 01/02/2020 0828   LDLCALC 170 (H) 01/02/2020 0828    CBC    Component Value Date/Time   WBC 6.5 01/02/2020 0828  RBC 4.54 01/02/2020 0828   HGB 13.5 01/02/2020 0828   HGB 12.7 11/25/2015 1046   HCT 41.0 01/02/2020 0828   HCT 37.3 11/25/2015 1046   PLT 312 01/02/2020 0828   PLT 334 11/25/2015 1046   MCV 90.3 01/02/2020 0828   MCV 88 11/25/2015 1046   MCH 29.7 01/02/2020 0828   MCHC 32.9 01/02/2020 0828   RDW 13.0 01/02/2020 0828   RDW 13.8 11/25/2015 1046   LYMPHSABS 2,659 01/02/2020 0828   LYMPHSABS 2.2 11/25/2015 1046   MONOABS 760 05/25/2016 1158   EOSABS 247 01/02/2020 0828   EOSABS 0.4 11/25/2015 1046   BASOSABS 111 01/02/2020 0828   BASOSABS 0.1 11/25/2015 1046    Hgb A1C Lab Results  Component Value Date   HGBA1C 5.3 01/02/2020             Assessment & Plan:    Nicki Reaper, NP This visit occurred during the SARS-CoV-2 public health emergency.  Safety protocols were in place, including screening questions prior to the visit, additional usage of staff PPE, and extensive cleaning of exam room while observing appropriate contact time as indicated for disinfecting solutions.

## 2021-04-14 NOTE — Patient Instructions (Signed)
Heart-Healthy Eating Plan Heart-healthy meal planning includes: Eating less unhealthy fats. Eating more healthy fats. Making other changes in your diet. Talk with your doctor or a diet specialist (dietitian) to create an eating plan that is right for you. What is my plan? Your doctor may recommend an eating plan that includes: Total fat: ______% or less of total calories a day. Saturated fat: ______% or less of total calories a day. Cholesterol: less than _________mg a day. What are tips for following this plan? Cooking Avoid frying your food. Try to bake, boil, grill, or broil it instead. You can also reduce fat by: Removing the skin from poultry. Removing all visible fats from meats. Steaming vegetables in water or broth. Meal planning  At meals, divide your plate into four equal parts: Fill one-half of your plate with vegetables and green salads. Fill one-fourth of your plate with whole grains. Fill one-fourth of your plate with lean protein foods. Eat 4-5 servings of vegetables per day. A serving of vegetables is: 1 cup of raw or cooked vegetables. 2 cups of raw leafy greens. Eat 4-5 servings of fruit per day. A serving of fruit is: 1 medium whole fruit.  cup of dried fruit.  cup of fresh, frozen, or canned fruit.  cup of 100% fruit juice. Eat more foods that have soluble fiber. These are apples, broccoli, carrots, beans, peas, and barley. Try to get 20-30 g of fiber per day. Eat 4-5 servings of nuts, legumes, and seeds per week: 1 serving of dried beans or legumes equals  cup after being cooked. 1 serving of nuts is  cup. 1 serving of seeds equals 1 tablespoon.  General information Eat more home-cooked food. Eat less restaurant, buffet, and fast food. Limit or avoid alcohol. Limit foods that are high in starch and sugar. Avoid fried foods. Lose weight if you are overweight. Keep track of how much salt (sodium) you eat. This is important if you have high blood  pressure. Ask your doctor to tell you more about this. Try to add vegetarian meals each week. Fats Choose healthy fats. These include olive oil and canola oil, flaxseeds, walnuts, almonds, and seeds. Eat more omega-3 fats. These include salmon, mackerel, sardines, tuna, flaxseed oil, and ground flaxseeds. Try to eat fish at least 2 times each week. Check food labels. Avoid foods with trans fats or high amounts of saturated fat. Limit saturated fats. These are often found in animal products, such as meats, butter, and cream. These are also found in plant foods, such as palm oil, palm kernel oil, and coconut oil. Avoid foods with partially hydrogenated oils in them. These have trans fats. Examples are stick margarine, some tub margarines, cookies, crackers, and other baked goods. What foods can I eat? Fruits All fresh, canned (in natural juice), or frozen fruits. Vegetables Fresh or frozen vegetables (raw, steamed, roasted, or grilled). Green salads. Grains Most grains. Choose whole wheat and whole grains most of the time. Rice andpasta, including brown rice and pastas made with whole wheat. Meats and other proteins Lean, well-trimmed beef, veal, pork, and lamb. Chicken and turkey without skin. All fish and shellfish. Wild duck, rabbit, pheasant, and venison. Egg whites or low-cholesterol egg substitutes. Dried beans, peas, lentils, and tofu. Seedsand most nuts. Dairy Low-fat or nonfat cheeses, including ricotta and mozzarella. Skim or 1% milk that is liquid, powdered, or evaporated. Buttermilk that is made with low-fatmilk. Nonfat or low-fat yogurt. Fats and oils Non-hydrogenated (trans-free) margarines. Vegetable oils, including soybean, sesame,   sunflower, olive, peanut, safflower, corn, canola, and cottonseed. Salad dressings or mayonnaisemade with a vegetable oil. Beverages Mineral water. Coffee and tea. Diet carbonated beverages. Sweets and desserts Sherbet, gelatin, and fruit ice. Small  amounts of dark chocolate. Limit all sweets and desserts. Seasonings and condiments All seasonings and condiments. The items listed above may not be a complete list of foods and drinks you can eat. Contact a dietitian for more options. What foods should I avoid? Fruits Canned fruit in heavy syrup. Fruit in cream or butter sauce. Fried fruit. Limitcoconut. Vegetables Vegetables cooked in cheese, cream, or butter sauce. Fried vegetables. Grains Breads that are made with saturated or trans fats, oils, or whole milk. Croissants. Sweet rolls. Donuts. High-fat crackers,such as cheese crackers. Meats and other proteins Fatty meats, such as hot dogs, ribs, sausage, bacon, rib-eye roast or steak. High-fat deli meats, such as salami and bologna. Caviar. Domestic duck andgoose. Organ meats, such as liver. Dairy Cream, sour cream, cream cheese, and creamed cottage cheese. Whole-milk cheeses. Whole or 2% milk that is liquid, evaporated, or condensed. Whole buttermilk. Cream sauce or high-fat cheese sauce. Yogurt that is made fromwhole milk. Fats and oils Meat fat, or shortening. Cocoa butter, hydrogenated oils, palm oil, coconut oil, palm kernel oil. Solid fats and shortenings, including bacon fat, salt pork, lard, and butter. Nondairy cream substitutes. Salad dressings with cheeseor sour cream. Beverages Regular sodas and juice drinks with added sugar. Sweets and desserts Frosting. Pudding. Cookies. Cakes. Pies. Milk chocolate or white chocolate.Buttered syrups. Full-fat ice cream or ice cream drinks. The items listed above may not be a complete list of foods and drinks to avoid. Contact a dietitian for more information. Summary Heart-healthy meal planning includes eating less unhealthy fats, eating more healthy fats, and making other changes in your diet. Eat a balanced diet. This includes fruits and vegetables, low-fat or nonfat dairy, lean protein, nuts and legumes, whole grains, and heart-healthy  oils and fats. This information is not intended to replace advice given to you by your health care provider. Make sure you discuss any questions you have with your healthcare provider. Document Revised: 10/21/2017 Document Reviewed: 09/24/2017 Elsevier Patient Education  2022 Elsevier Inc.  

## 2021-04-14 NOTE — Assessment & Plan Note (Signed)
Lipid profile today Encouraged her to consume a low-fat diet increase aerobic exercise

## 2021-04-14 NOTE — Assessment & Plan Note (Signed)
Managed with Duloxetine

## 2021-04-14 NOTE — Assessment & Plan Note (Signed)
Continue Albuterol as needed 

## 2021-04-15 NOTE — Addendum Note (Signed)
Addended by: Lorre Munroe on: 04/15/2021 09:20 AM   Modules accepted: Orders

## 2021-05-05 ENCOUNTER — Other Ambulatory Visit: Payer: Self-pay | Admitting: Family Medicine

## 2021-05-06 ENCOUNTER — Telehealth: Payer: Self-pay | Admitting: Internal Medicine

## 2021-05-06 NOTE — Telephone Encounter (Signed)
Requested medication sent in to pharmacy on 05/06/21.

## 2021-05-06 NOTE — Telephone Encounter (Signed)
Copied from CRM [id number]. Topic: Quick Communication - Rx Refill/Question >> May 06, 2021  9:41 AM Gaetana Michaelis A wrote: Medication: DULoxetine (CYMBALTA) 30 MG capsule [[id number]]   Has the patient contacted their pharmacy? Yes.   (Agent: If no, request that the patient contact the pharmacy for the refill.) (Agent: If yes, when and what did the pharmacy advise?)  Preferred Pharmacy (with phone number or street name): [id number] [place], [place] - [address]  Phone:  [phone] Fax:  [phone]  Agent: Please be advised that RX refills may take up to 3 business days. We ask that you follow-up with your pharmacy.

## 2021-05-06 NOTE — Telephone Encounter (Signed)
Requested Prescriptions  Pending Prescriptions Disp Refills  . DULoxetine (CYMBALTA) 30 MG capsule [Pharmacy Med Name: DULoxetine HCL DR 30 MG CAPSULE] 90 capsule 1    Sig: TAKE ONE CAPSULE BY MOUTH DAILY     Psychiatry: Antidepressants - SNRI Passed - 05/05/2021  9:26 AM      Passed - Completed PHQ-2 or PHQ-9 in the last 360 days      Passed - Last BP in normal range    BP Readings from Last 1 Encounters:  04/14/21 129/63         Passed - Valid encounter within last 6 months    Recent Outpatient Visits          3 weeks ago Mixed hyperlipidemia   [facility] [place], Salvadore Oxford, NP   8 months ago Vertigo   [facility] Smitty Cords, DO   9 months ago Hair loss   [facility], [place], [place]   11 months ago 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

## 2021-06-24 ENCOUNTER — Encounter: Payer: Self-pay | Admitting: Internal Medicine

## 2021-06-24 ENCOUNTER — Other Ambulatory Visit: Payer: Self-pay

## 2021-06-24 ENCOUNTER — Ambulatory Visit (INDEPENDENT_AMBULATORY_CARE_PROVIDER_SITE_OTHER): Payer: 59 | Admitting: Internal Medicine

## 2021-06-24 ENCOUNTER — Ambulatory Visit
Admission: RE | Admit: 2021-06-24 | Discharge: 2021-06-24 | Disposition: A | Discharge status: Home / Self Care | Payer: 59 | Attending: Internal Medicine | Admitting: Internal Medicine

## 2021-06-24 ENCOUNTER — Ambulatory Visit
Admission: RE | Admit: 2021-06-24 | Discharge: 2021-06-24 | Disposition: A | Discharge status: Home / Self Care | Payer: 59 | Source: Ambulatory Visit | Attending: Internal Medicine | Admitting: Internal Medicine

## 2021-06-24 VITALS — BP 126/72 | HR 70 | Temp 97.5°F | Ht 63.0 in | Wt 161.4 lb

## 2021-06-24 DIAGNOSIS — M25472 Effusion, left ankle: Secondary | ICD-10-CM

## 2021-06-24 DIAGNOSIS — M79672 Pain in left foot: Secondary | ICD-10-CM

## 2021-06-24 DIAGNOSIS — M25572 Pain in left ankle and joints of left foot: Secondary | ICD-10-CM | POA: Diagnosis not present

## 2021-06-24 DIAGNOSIS — M79675 Pain in left toe(s): Secondary | ICD-10-CM | POA: Diagnosis not present

## 2021-06-24 DIAGNOSIS — W19XXXA Unspecified fall, initial encounter: Secondary | ICD-10-CM

## 2021-06-24 DIAGNOSIS — M7989 Other specified soft tissue disorders: Secondary | ICD-10-CM

## 2021-06-24 NOTE — Progress Notes (Signed)
Subjective:    Patient ID: Karen Martinez, female    DOB: Aug 15, 1966, 55 y.o.   MRN: [id number]  HPI  Pt presents to the clinic today with c/o left ankle pain and swelling and pain in her left great toe. She reports this stated  4 days ago after a fall. She turned around, rolled her ankle and fell on her hands and knees. She describes the pain as achy and throbbing. She has noticed associated swelling and bruising. The pain is worse with weight bearing. She describes the pain in the left great toe and sharp and stabbing, worse with bending the toe down. She denies numbness or tingling. She has been trying to elevate her left leg, take Tylenol and apply ice with minimal improvement in her symptoms.   Review of Systems     Past Medical History:  Diagnosis Date   Allergy    Asthma    Depression    Emphysema of lung (HCC)    Heart murmur    heart arrhythmias   Hyperlipidemia    Thyroid disease    Urine incontinence     Current Outpatient Medications  Medication Sig Dispense Refill   albuterol (VENTOLIN HFA) 108 (90 Base) MCG/ACT inhaler Inhale 2 puffs into the lungs every 6 (six) hours as needed for wheezing or shortness of breath. 6.7 g 1   CINNAMON PO Take 1,200 mg by mouth 2 (two) times daily.     DULoxetine (CYMBALTA) 30 MG capsule TAKE ONE CAPSULE BY MOUTH DAILY 90 capsule 1   loratadine (CLARITIN) 10 MG tablet Take 10 mg by mouth daily.     No current facility-administered medications for this visit.    Allergies  Allergen Reactions   Keflex [Cephalexin] Hives   Poison Oak Extract [Poison Oak Extract]    Sulfa Antibiotics     Other reaction(s): Other (See Comments) Other Reaction: Possible rash at end of course    Family History  Problem Relation Age of Onset   Stroke Father    Heart disease Father    Cancer Father        facial    Hyperlipidemia Father    Cancer Maternal Grandfather    Heart disease Maternal Grandfather    Alcohol abuse Maternal Grandfather     Alcohol abuse Paternal Grandfather     Social History   Socioeconomic History   Marital status: Married    Spouse name: Not on file   Number of children: Not on file   Years of education: Not on file   Highest education level: Not on file  Occupational History   Not on file  Tobacco Use   Smoking status: Never   Smokeless tobacco: Never  Substance and Sexual Activity   Alcohol use: Yes    Alcohol/week: 1.0 standard drink    Types: 1 Glasses of wine per week    Comment: occasional   Drug use: No   Sexual activity: Not on file  Other Topics Concern   Not on file  Social History Narrative   Not on file   Social Determinants of Health   Financial Resource Strain: Not on file  Food Insecurity: Not on file  Transportation Needs: Not on file  Physical Activity: Not on file  Stress: Not on file  Social Connections: Not on file  Intimate Partner Violence: Not on file     Constitutional: Denies fever, malaise, fatigue, headache or abrupt weight changes.  Respiratory: Denies difficulty breathing, shortness of breath,  cough or sputum production.   Cardiovascular: Denies chest pain, chest tightness, palpitations or swelling in the hands or feet.  Musculoskeletal: Pt reports left ankle and foot pain and swelling, difficulty with gait. Denies decrease in range of motion, muscle pain.  Skin: Pt reports bruising of left ankle and foot. Denies redness, rashes, lesions or ulcercations.  Neurological: Denies numbness, tingling or problems with balance and coordination.    No other specific complaints in a complete review of systems (except as listed in HPI above).  Objective:   Physical Exam   BP 126/72 (BP Location: Right Arm, Patient Position: Sitting)   Pulse 70   Temp (!) 97.5 F (36.4 C) (Temporal)   Ht 5\' 3"  (1.6 m)   Wt 161 lb 6.4 oz (73.2 kg)   LMP 01/15/2018   SpO2 100%   BMI 28.59 kg/m   Wt Readings from Last 3 Encounters:  04/14/21 160 lb (72.6 kg)  09/03/20  160 lb 6.4 oz (72.8 kg)  08/09/20 158 lb 9.6 oz (71.9 kg)    General: Appears her stated age, overweight, in NAD. Skin: Warm, dry and intact. Bruising noted posterior and inferior to the left lateral malleolus. Bruising noted over the dorsal surface of the left midfoot. Bruising noted along the medial edge of the distal metatarsals. Cardiovascular: Normal rate. Pedal pulse 2+ on the left. Pulmonary/Chest: Normal effort. Musculoskeletal: Decrease flexion, extension and rotation of the left ankle. 1+ swelling of the left ankle and foot. Pain with passive plantarflexion of the left great toe. Limping gait. Neurological: Alert and oriented.   BMET    Component Value Date/Time   NA 140 01/02/2020 0828   NA 140 08/20/2015 1019   K 4.1 01/02/2020 0828   CL 104 01/02/2020 0828   CO2 26 01/02/2020 0828   GLUCOSE 86 01/02/2020 0828   BUN 8 01/02/2020 0828   BUN 9 08/20/2015 1019   CREATININE 0.69 01/02/2020 0828   CALCIUM 9.7 01/02/2020 0828   GFRNONAA >89 05/25/2016 1158   GFRAA >89 05/25/2016 1158    Lipid Panel     Component Value Date/Time   CHOL 224 (H) 04/14/2021 1502   CHOL 213 (H) 08/20/2015 1019   TRIG 354 (H) 04/14/2021 1502   HDL 43 (L) 04/14/2021 1502   HDL 47 08/20/2015 1019   CHOLHDL 5.2 (H) 04/14/2021 1502   LDLCALC 130 (H) 04/14/2021 1502    CBC    Component Value Date/Time   WBC 6.5 01/02/2020 0828   RBC 4.54 01/02/2020 0828   HGB 13.5 01/02/2020 0828   HGB 12.7 11/25/2015 1046   HCT 41.0 01/02/2020 0828   HCT 37.3 11/25/2015 1046   PLT 312 01/02/2020 0828   PLT 334 11/25/2015 1046   MCV 90.3 01/02/2020 0828   MCV 88 11/25/2015 1046   MCH 29.7 01/02/2020 0828   MCHC 32.9 01/02/2020 0828   RDW 13.0 01/02/2020 0828   RDW 13.8 11/25/2015 1046   LYMPHSABS 2,659 01/02/2020 0828   LYMPHSABS 2.2 11/25/2015 1046   MONOABS 760 05/25/2016 1158   EOSABS 247 01/02/2020 0828   EOSABS 0.4 11/25/2015 1046   BASOSABS 111 01/02/2020 0828   BASOSABS 0.1 11/25/2015  1046    Hgb A1C Lab Results  Component Value Date   HGBA1C 5.3 01/02/2020           Assessment & Plan:   Left Ankle, Left Foot, Left Great Toe Pain and Swelling s/p Fall:  X-ray left ankle today X-ray left foot today Encourage  rest, ice, compression and elevation Okay to take anti-inflammatories OTC every 8 hours as needed  We will follow-up after x-ray with further recommendation and treatment plan   Nicki Reaper, NP This visit occurred during the SARS-CoV-2 public health emergency.  Safety protocols were in place, including screening questions prior to the visit, additional usage of staff PPE, and extensive cleaning of exam room while observing appropriate contact time as indicated for disinfecting solutions.

## 2021-06-24 NOTE — Patient Instructions (Signed)
RICE Therapy for Routine Care of Injuries Many injuries can be cared for with rest, ice, compression, and elevation (RICE therapy). This includes: Resting the injured body part. Putting ice on the injury. Putting pressure (compression) on the injury. Raising the injured part (elevation). Using RICE therapy can help to lessen pain and swelling. Supplies needed: Ice. Plastic bag. Towel. Elastic bandage. Pillow or pillows to raise your injured body part. How to care for your injury with RICE therapy Rest Try to rest the injured part of your body. You can go back to your normal activities when your doctor says it is okay to do them and when you can do themwithout pain. If you rest the injury too much, it may not heal as well. Some injuries heal better with early movement instead of resting for too long. Ask your doctor ifyou should do exercises to help your injury get better. Ice  If told, put ice on the injured area. To do this: Put ice in a plastic bag. Place a towel between your skin and the bag. Leave the ice on for 20 minutes, 2-3 times a day. Take off the ice if your skin turns bright red. This is very important. If you cannot feel pain, heat, or cold, you have a greater risk of damage to the area. Do not put ice on your bare skin. Use ice for as many days as your doctor tells you to use it.  Compression Put pressure on the injured area. This can be done with an elastic bandage. If this type of bandage has been put on your injury: Follow instructions on the package the bandage came in about how to use it. Do not wrap the bandage too tightly. Wrap the bandage more loosely if part of your body beyond the bandage is blue, swollen, cold, painful, or loses feeling. Take off the bandage and put it on again every 3-4 hours or as told by your doctor. See your doctor if the bandage seems to make your problems worse.  Elevation Raise the injured area above the level of your heart while you  are sitting orlying down. Follow these instructions at home: If your symptoms get worse or last a long time, make a follow-up appointment with your doctor. You may need to have imaging tests, such as X-rays or an MRI. If you have imaging tests, ask how to get your results when they are ready. Return to your normal activities when your doctor says that it is safe. Keep all follow-up visits. Contact a doctor if: You keep having pain and swelling. Your symptoms get worse. Get help right away if: You have sudden, very bad pain at your injury or lower than your injury. You have redness or more swelling around your injury. You have tingling or numbness at your injury or lower than your injury, and it does not go away when you take off the bandage. Summary Many injuries can be cared for using rest, ice, compression, and elevation (RICE therapy). You can go back to your normal activities when your doctor says it is okay and when you can do them without pain. Put ice on the injured area as told by your doctor. Get help if your symptoms get worse or if you keep having pain and swelling. This information is not intended to replace advice given to you by your health care provider. Make sure you discuss any questions you have with your healthcare provider. Document Revised: 06/06/2020 Document Reviewed: 06/06/2020 Elsevier Patient Education    2022 Elsevier Inc.  

## 2021-06-25 DIAGNOSIS — S92423A Displaced fracture of distal phalanx of unspecified great toe, initial encounter for closed fracture: Secondary | ICD-10-CM

## 2021-06-25 HISTORY — DX: Displaced fracture of distal phalanx of unspecified great toe, initial encounter for closed fracture: S92.423A

## 2021-07-15 ENCOUNTER — Telehealth: Payer: Self-pay | Admitting: Internal Medicine

## 2021-07-15 NOTE — Telephone Encounter (Signed)
Referral Request - Has patient seen PCP for this complaint? No. *If NO, is insurance requiring patient see PCP for this issue before PCP can refer them? Referral for which specialty: Gastroenterology AND Mammogram Preferred provider/office: [facility] Reason for referral: Routine Preventative

## 2021-07-17 NOTE — Telephone Encounter (Signed)
She is due for her annual exam. She needs to schedule this, referral to GI will be placed at that time

## 2021-07-18 ENCOUNTER — Other Ambulatory Visit: Payer: Self-pay | Admitting: Internal Medicine

## 2021-07-18 NOTE — Telephone Encounter (Signed)
I left a detail message on the patient vm with the patient recommendation.

## 2021-07-21 DIAGNOSIS — R52 Pain, unspecified: Secondary | ICD-10-CM | POA: Insufficient documentation

## 2021-07-21 HISTORY — DX: Pain, unspecified: R52

## 2021-08-22 ENCOUNTER — Encounter: Payer: 59 | Admitting: Internal Medicine

## 2021-10-30 ENCOUNTER — Other Ambulatory Visit: Payer: Self-pay

## 2021-10-30 MED ORDER — DULOXETINE HCL 30 MG PO CPEP: 30.0000 mg | ORAL_CAPSULE | Freq: Every day | ORAL | 1 refills | Status: DC

## 2022-01-09 ENCOUNTER — Ambulatory Visit: Payer: Self-pay | Admitting: Internal Medicine

## 2022-01-09 NOTE — Progress Notes (Deleted)
Subjective:    Patient ID: Karen Martinez, female    DOB: 08-Feb-1966, 56 y.o.   MRN: [id number]  HPI  Patient presents to clinic today with complaint of motion sickness.  Review of Systems  Past Medical History:  Diagnosis Date   Allergy    Asthma    Depression    Emphysema of lung (HCC)    Heart murmur    heart arrhythmias   Hyperlipidemia    Thyroid disease    Urine incontinence     Current Outpatient Medications  Medication Sig Dispense Refill   albuterol (VENTOLIN HFA) 108 (90 Base) MCG/ACT inhaler Inhale 2 puffs into the lungs every 6 (six) hours as needed for wheezing or shortness of breath. 6.7 g 1   CINNAMON PO Take 1,200 mg by mouth 2 (two) times daily.     DULoxetine (CYMBALTA) 30 MG capsule Take 1 capsule (30 mg total) by mouth daily. Please schedule an office visit (Physical) before anymore refills. 90 capsule 1   loratadine (CLARITIN) 10 MG tablet Take 10 mg by mouth daily.     No current facility-administered medications for this visit.    Allergies  Allergen Reactions   Keflex [Cephalexin] Hives   Poison Oak Extract [Poison Oak Extract]    Sulfa Antibiotics     Other reaction(s): Other (See Comments) Other Reaction: Possible rash at end of course    Family History  Problem Relation Age of Onset   Stroke Father    Heart disease Father    Cancer Father        facial    Hyperlipidemia Father    Cancer Maternal Grandfather    Heart disease Maternal Grandfather    Alcohol abuse Maternal Grandfather    Alcohol abuse Paternal Grandfather     Social History   Socioeconomic History   Marital status: Married    Spouse name: Not on file   Number of children: Not on file   Years of education: Not on file   Highest education level: Not on file  Occupational History   Not on file  Tobacco Use   Smoking status: Never   Smokeless tobacco: Never  Vaping Use   Vaping Use: Never used  Substance and Sexual Activity   Alcohol use: Yes     Alcohol/week: 1.0 standard drink    Types: 1 Glasses of wine per week    Comment: occasional   Drug use: No   Sexual activity: Not on file  Other Topics Concern   Not on file  Social History Narrative   Not on file   Social Determinants of Health   Financial Resource Strain: Not on file  Food Insecurity: Not on file  Transportation Needs: Not on file  Physical Activity: Not on file  Stress: Not on file  Social Connections: Not on file  Intimate Partner Violence: Not on file     Constitutional: Denies fever, malaise, fatigue, headache or abrupt weight changes.  HEENT: Denies eye pain, eye redness, ear pain, ringing in the ears, wax buildup, runny nose, nasal congestion, bloody nose, or sore throat. Respiratory: Denies difficulty breathing, shortness of breath, cough or sputum production.   Cardiovascular: Denies chest pain, chest tightness, palpitations or swelling in the hands or feet.  Gastrointestinal: Patient reports nausea.  Denies abdominal pain, bloating, constipation, diarrhea or blood in the stool.  GU: Denies urgency, frequency, pain with urination, burning sensation, blood in urine, odor or discharge. Musculoskeletal: Denies decrease in range of motion,  difficulty with gait, muscle pain or joint pain and swelling.  Skin: Denies redness, rashes, lesions or ulcercations.  Neurological: Denies dizziness, difficulty with memory, difficulty with speech or problems with balance and coordination.  Psych: Denies anxiety, depression, SI/HI.  No other specific complaints in a complete review of systems (except as listed in HPI above).     Objective:   Physical Exam   LMP 01/15/2018  Wt Readings from Last 3 Encounters:  06/24/21 161 lb 6.4 oz (73.2 kg)  04/14/21 160 lb (72.6 kg)  09/03/20 160 lb 6.4 oz (72.8 kg)    General: Appears their stated age, well developed, well nourished in NAD. Skin: Warm, dry and intact. No rashes, lesions or ulcerations noted. HEENT: Head:  normal shape and size; Eyes: sclera white, no icterus, conjunctiva pink, PERRLA and EOMs intact; Ears: Tm's gray and intact, normal light reflex; Nose: mucosa pink and moist, septum midline; Throat/Mouth: Teeth present, mucosa pink and moist, no exudate, lesions or ulcerations noted.  Neck:  Neck supple, trachea midline. No masses, lumps or thyromegaly present.  Cardiovascular: Normal rate and rhythm. S1,S2 noted.  No murmur, rubs or gallops noted. No JVD or BLE edema. No carotid bruits noted. Pulmonary/Chest: Normal effort and positive vesicular breath sounds. No respiratory distress. No wheezes, rales or ronchi noted.  Abdomen: Soft and nontender. Normal bowel sounds. No distention or masses noted. Liver, spleen and kidneys non palpable. Musculoskeletal: Normal range of motion. No signs of joint swelling. No difficulty with gait.  Neurological: Alert and oriented. Cranial nerves II-XII grossly intact. Coordination normal.  Psychiatric: Mood and affect normal. Behavior is normal. Judgment and thought content normal.    BMET    Component Value Date/Time   NA 140 01/02/2020 0828   NA 140 08/20/2015 1019   K 4.1 01/02/2020 0828   CL 104 01/02/2020 0828   CO2 26 01/02/2020 0828   GLUCOSE 86 01/02/2020 0828   BUN 8 01/02/2020 0828   BUN 9 08/20/2015 1019   CREATININE 0.69 01/02/2020 0828   CALCIUM 9.7 01/02/2020 0828   GFRNONAA >89 05/25/2016 1158   GFRAA >89 05/25/2016 1158    Lipid Panel     Component Value Date/Time   CHOL 224 (H) 04/14/2021 1502   CHOL 213 (H) 08/20/2015 1019   TRIG 354 (H) 04/14/2021 1502   HDL 43 (L) 04/14/2021 1502   HDL 47 08/20/2015 1019   CHOLHDL 5.2 (H) 04/14/2021 1502   LDLCALC 130 (H) 04/14/2021 1502    CBC    Component Value Date/Time   WBC 6.5 01/02/2020 0828   RBC 4.54 01/02/2020 0828   HGB 13.5 01/02/2020 0828   HGB 12.7 11/25/2015 1046   HCT 41.0 01/02/2020 0828   HCT 37.3 11/25/2015 1046   PLT 312 01/02/2020 0828   PLT 334 11/25/2015  1046   MCV 90.3 01/02/2020 0828   MCV 88 11/25/2015 1046   MCH 29.7 01/02/2020 0828   MCHC 32.9 01/02/2020 0828   RDW 13.0 01/02/2020 0828   RDW 13.8 11/25/2015 1046   LYMPHSABS 2,659 01/02/2020 0828   LYMPHSABS 2.2 11/25/2015 1046   MONOABS 760 05/25/2016 1158   EOSABS 247 01/02/2020 0828   EOSABS 0.4 11/25/2015 1046   BASOSABS 111 01/02/2020 0828   BASOSABS 0.1 11/25/2015 1046    Hgb A1C Lab Results  Component Value Date   HGBA1C 5.3 01/02/2020           Assessment & Plan:    Nicki Reaper, NP

## 2022-07-12 ENCOUNTER — Other Ambulatory Visit: Payer: Self-pay | Admitting: Internal Medicine

## 2022-07-13 NOTE — Telephone Encounter (Signed)
Pt has new PCP with [facility]  Requested Prescriptions  Refused Prescriptions Disp Refills   DULoxetine (CYMBALTA) 30 MG capsule [Pharmacy Med Name: DULoxetine HCL DR 30 MG CAPSULE] 90 capsule 1    Sig: TAKE ONE CAPSULE BY MOUTH DAILY     Psychiatry: Antidepressants - SNRI - duloxetine Failed - 07/12/2022  6:51 AM      Failed - Cr in normal range and within 360 days    Creat  Date Value Ref Range Status  01/02/2020 0.69 0.50 - 1.05 mg/dL Final    Comment:    For patients >49 years of age, the reference limit for Creatinine is approximately 13% higher for people identified as African-American. .          Failed - eGFR is 30 or above and within 360 days    GFR, Est African American  Date Value Ref Range Status  05/25/2016 >89 >=60 mL/min Final   GFR, Est Non African American  Date Value Ref Range Status  05/25/2016 >89 >=60 mL/min Final         Failed - Completed PHQ-2 or PHQ-9 in the last 360 days      Failed - Valid encounter within last 6 months    Recent Outpatient Visits           1 year ago Pain and swelling of left ankle   [facility] [place], Coralie Keens, NP   1 year ago Mixed hyperlipidemia   [facility] [place], Coralie Keens, NP   1 year ago [place], DO   1 year ago Hair loss   [place], FNP   2 years ago Moderate recurrent major depression [facility])   [facility], Lupita Raider, FNP              Passed - Last BP in normal range    BP Readings from Last 1 Encounters:  06/24/21 126/72

## 2022-08-03 ENCOUNTER — Other Ambulatory Visit: Payer: Self-pay | Admitting: Internal Medicine

## 2022-08-04 NOTE — Telephone Encounter (Signed)
Requested medications are due for refill today.  yes  Requested medications are on the active medications list.  yes  Last refill. 10/30/2021 #90 1 rf  Future visit scheduled.   no  Notes to clinic.  Pt not seen for over 1. Pt appears to have a new PCP.    Requested Prescriptions  Pending Prescriptions Disp Refills   DULoxetine (CYMBALTA) 30 MG capsule [Pharmacy Med Name: DULoxetine HCL DR 30 MG CAPSULE] 90 capsule 1    Sig: TAKE ONE CAPSULE BY MOUTH DAILY     Psychiatry: Antidepressants - SNRI - duloxetine Failed - 08/03/2022  9:49 AM      Failed - Cr in normal range and within 360 days    Creat  Date Value Ref Range Status  01/02/2020 0.69 0.50 - 1.05 mg/dL Final    Comment:    For patients >43 years of age, the reference limit for Creatinine is approximately 13% higher for people identified as African-American. .          Failed - eGFR is 30 or above and within 360 days    GFR, Est African American  Date Value Ref Range Status  05/25/2016 >89 >=60 mL/min Final   GFR, Est Non African American  Date Value Ref Range Status  05/25/2016 >89 >=60 mL/min Final         Failed - Completed PHQ-2 or PHQ-9 in the last 360 days      Failed - Valid encounter within last 6 months    Recent Outpatient Visits           1 year ago Pain and swelling of left ankle   [facility] [place], Coralie Keens, NP   1 year ago Mixed hyperlipidemia   [facility] [place], Coralie Keens, NP   1 year ago [place], DO   1 year ago Hair loss   [place], FNP   2 years ago Moderate recurrent major depression [facility])   [facility], Lupita Raider, FNP              Passed - Last BP in normal range    BP Readings from Last 1 Encounters:  06/24/21 126/72

## 2022-08-22 IMAGING — DX DG FOOT COMPLETE 3+V*L*
3 series · 3 of 3 positions shown · non-contrast
Comparison: None.

CLINICAL DATA: Pain over mid metatarsals. Left great toe. Bruising.
Recent fall.

EXAM:
LEFT FOOT - COMPLETE 3+ VIEW

[foot ap]
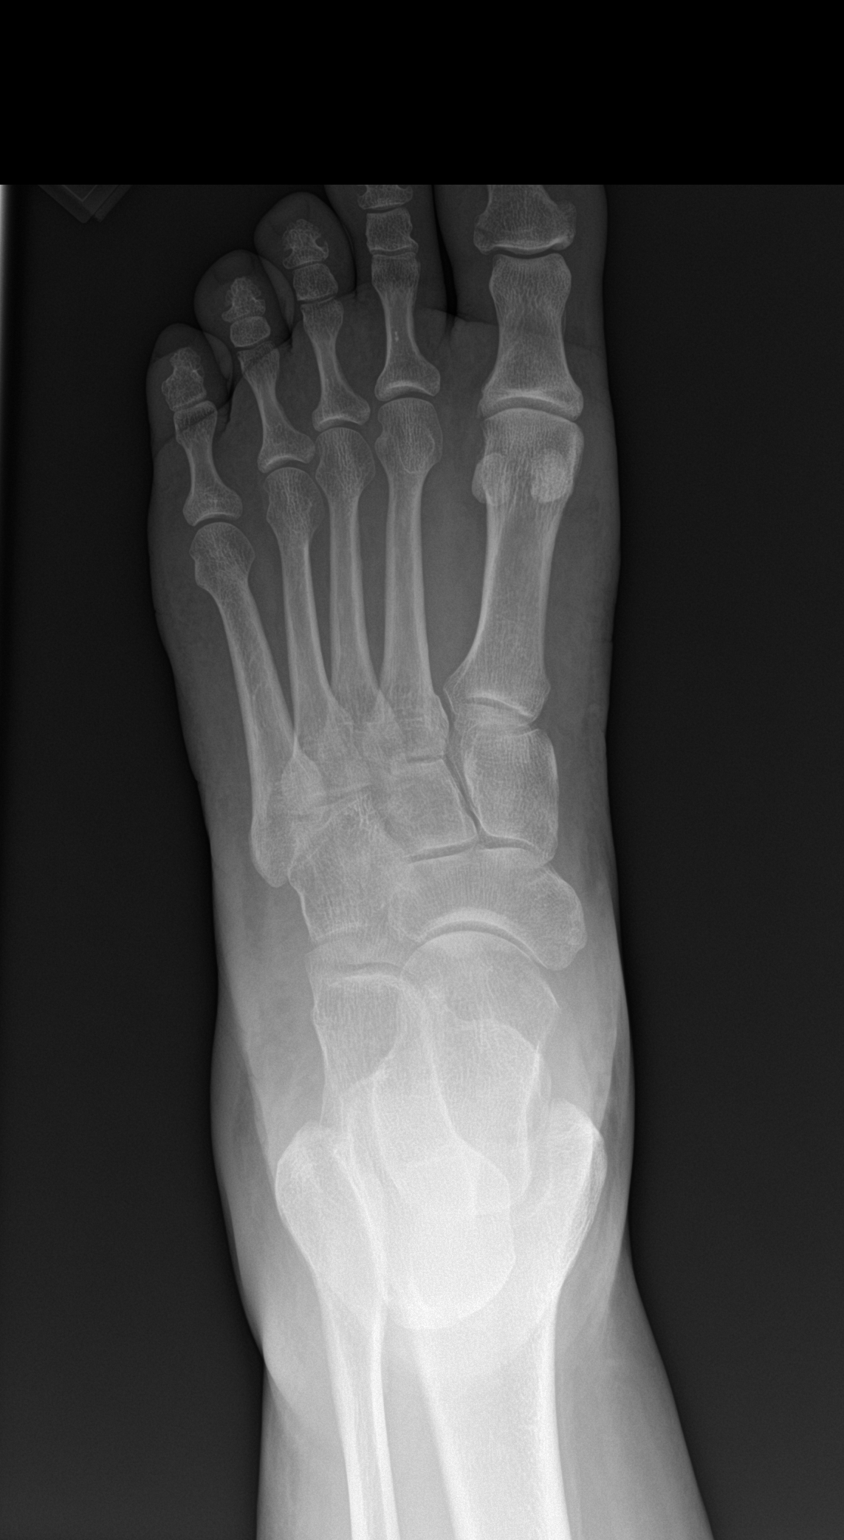

[foot obl]
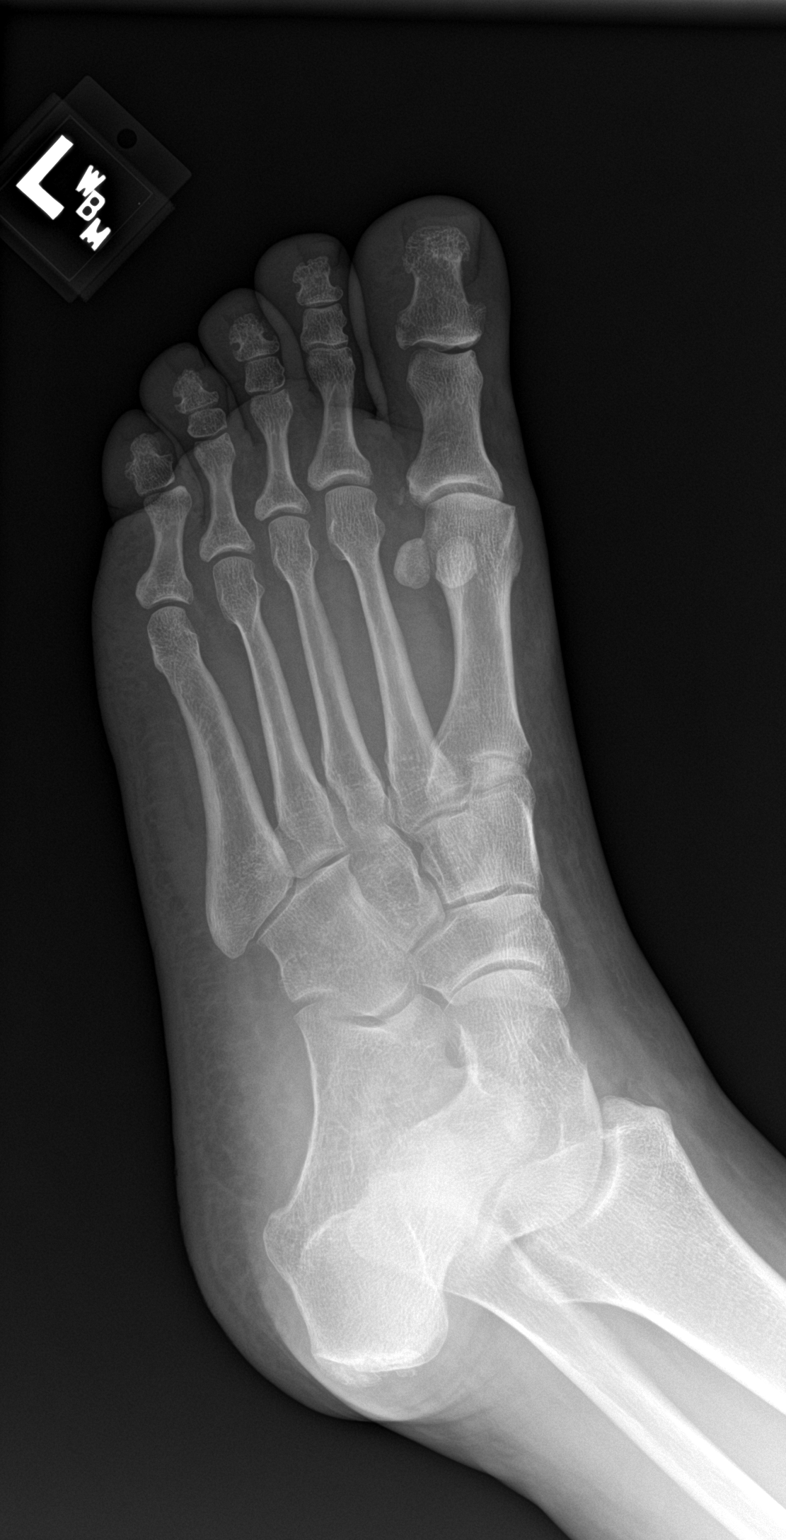

[foot lat]
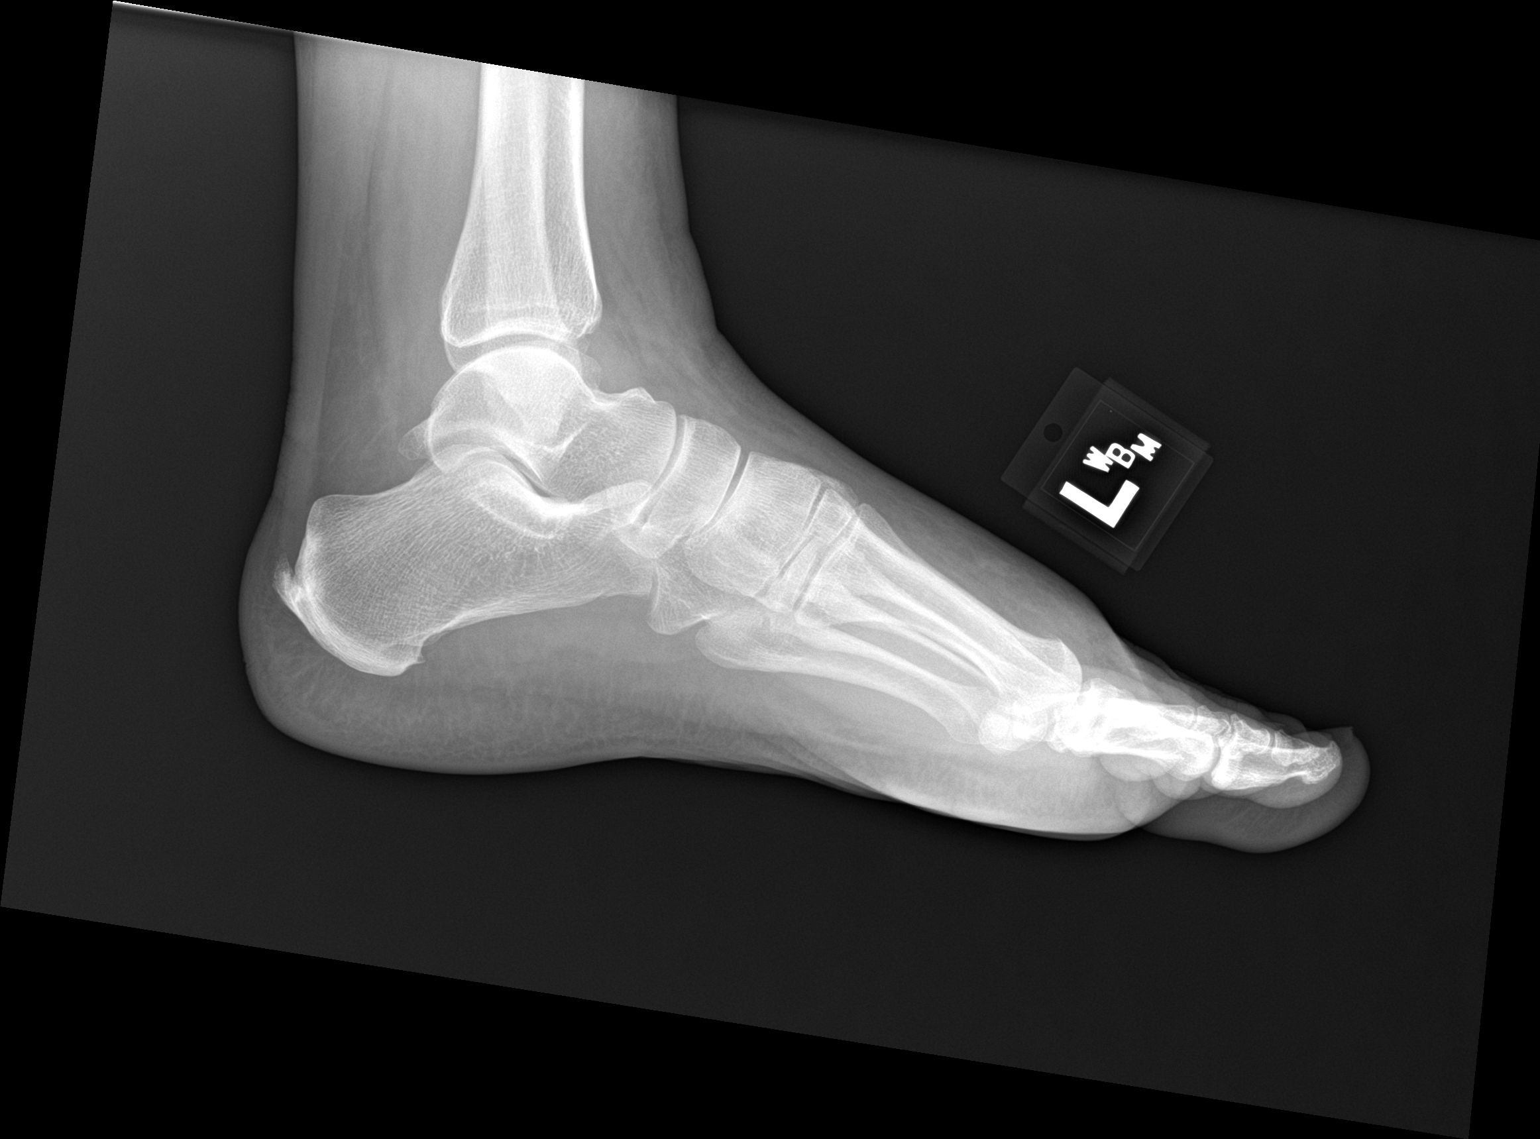

[3 of 3 positions shown; findings below may reference images not displayed]

FINDINGS: Oblique nondisplaced fracture of the great toe distal phalanx.
Fracture involves the lateral aspect and extends to the articular
surface. No additional fracture of the foot. Minimal degenerative
change of the first metatarsal phalangeal joint no erosions. There
is dorsal soft tissue edema.
IMPRESSION: Oblique nondisplaced great toe distal phalanx fracture with
intra-articular extension.

## 2022-12-10 LAB — HM MAMMOGRAPHY

## 2023-09-07 ENCOUNTER — Encounter: Payer: Self-pay | Admitting: General Practice

## 2023-09-07 ENCOUNTER — Ambulatory Visit: Payer: No Typology Code available for payment source | Admitting: General Practice

## 2023-09-07 VITALS — BP 124/72 | HR 82 | Temp 98.2°F | Resp 18 | Ht 63.0 in | Wt 170.5 lb

## 2023-09-07 DIAGNOSIS — F419 Anxiety disorder, unspecified: Secondary | ICD-10-CM

## 2023-09-07 DIAGNOSIS — F331 Major depressive disorder, recurrent, moderate: Secondary | ICD-10-CM

## 2023-09-07 DIAGNOSIS — J4540 Moderate persistent asthma, uncomplicated: Secondary | ICD-10-CM | POA: Diagnosis not present

## 2023-09-07 DIAGNOSIS — R1310 Dysphagia, unspecified: Secondary | ICD-10-CM | POA: Insufficient documentation

## 2023-09-07 DIAGNOSIS — Z7689 Persons encountering health services in other specified circumstances: Secondary | ICD-10-CM | POA: Insufficient documentation

## 2023-09-07 DIAGNOSIS — E785 Hyperlipidemia, unspecified: Secondary | ICD-10-CM | POA: Diagnosis not present

## 2023-09-07 NOTE — Assessment & Plan Note (Signed)
 Controlled.

## 2023-09-07 NOTE — Assessment & Plan Note (Signed)
 Uncontrolled.   Last lipid panel was in April, 2024.   She recently started a new supplement that is suppose to help lower cholesterol.   Will check lipid in April.

## 2023-09-07 NOTE — Assessment & Plan Note (Signed)
 Unclear etiology.   Differentials include GERD, esophageal varices.   Discussed with patient to continue the log and write down trigger foods or liquids that could be contributing.   She would like to hold off on a PPI right now.   Will continue to monitor.

## 2023-09-07 NOTE — Assessment & Plan Note (Signed)
 EMR reviewed briefly.

## 2023-09-07 NOTE — Progress Notes (Signed)
 New Patient Office Visit  Subjective    Patient ID: Karen Martinez, female    DOB: 12/27/1965  Age: 58 y.o. MRN: [id number]  CC:  Chief Complaint  Patient presents with   Establish Care    HPI Karen Martinez is a 58 y.o. female presents to establish care.   Last PCP/physical/labs: [facility]; April 2024.  Asthma: diagnosed several years ago. Contributed to the cat. She is doing well with that.   Anxiety and depression: diagnosed 3 years ago. She tried many other medications and then was started on Cymbalta  and she is tolerating it well. And it controls her symptoms well. She does not want to increase it. She will consider therapy and will update if she is interested in a referral. She may need a refill prior to April. She will let me know.   HLD: diagnosed several years ago. She has tried multiple medications but has not been able to tolerate the statins and non statins. Due to the side effects, she has been managing with diet and exercise. Last lipid panel was completed in April 2024. She has started taking Berberine chloride last week to help with the cholesterol.   Abnormal mammogram: She had a mammogram on 04/28/22, which showed left breast calcifications. She had a diagnostic mammogram done on the left breast on 06/22/22, which showed benign left breast calcifications. She had another diagnostic on 12/10/22, which showed left breast calcifications. Six month mammogram was recommended. She has not had anymore since then. She is going to call them and get it scheduled.   Difficulty swallowing: She has noticed that at times, she has difficulty swallowing first bite of her first meal of the day. This is intermittent. Does not cover every day or with every meal. She has started a log of the days it occurs. She denies any history of heartburn, nausea or vomiting. This only occurs with solid foods and only for the first bite. Last incident was several weeks ago.    Outpatient Encounter Medications  as of 09/07/2023  Medication Sig   Berberine Chloride (BERBERINE HCI PO) Take 400 mg by mouth every other day.   Cholecalciferol (VITAMIN D3) 125 MCG (5000 UT) TABS Take 1 tablet by mouth every other day.   CINNAMON PO Take 600 mg by mouth every other day.   DULoxetine  (CYMBALTA ) 30 MG capsule Take 1 capsule (30 mg total) by mouth daily. Please schedule an office visit (Physical) before anymore refills.   loratadine (CLARITIN) 10 MG tablet Take 10 mg by mouth daily.   VITAMIN K PO Take 1 tablet by mouth every other day.   [DISCONTINUED] albuterol  (VENTOLIN  HFA) 108 (90 Base) MCG/ACT inhaler Inhale 2 puffs into the lungs every 6 (six) hours as needed for wheezing or shortness of breath.   No facility-administered encounter medications on file as of 09/07/2023.    Past Medical History:  Diagnosis Date   Allergy    Asthma    Closed fracture of distal phalanx of great toe 06/25/2021   Depression    Emphysema of lung (HCC)    Heart murmur    heart arrhythmias   Hyperlipidemia    Inflammatory pain 07/21/2021   Thyroid  disease    Urine incontinence     Past Surgical History:  Procedure Laterality Date   BREAST SURGERY  2011   breast biopsy----cyst drainage on breast    Family History  Problem Relation Age of Onset   Stroke Father    Heart disease  Father    Cancer Father        facial    Hyperlipidemia Father    Cancer Maternal Grandfather    Heart disease Maternal Grandfather    Alcohol abuse Maternal Grandfather    Alcohol abuse Paternal Grandfather     Social History   Socioeconomic History   Marital status: Married    Spouse name: Not on file   Number of children: Not on file   Years of education: Not on file   Highest education level: Not on file  Occupational History   Not on file  Tobacco Use   Smoking status: Never   Smokeless tobacco: Never  Vaping Use   Vaping status: Never Used  Substance and Sexual Activity   Alcohol use: Yes    Alcohol/week: 1.0  standard drink of alcohol    Types: 1 Glasses of wine per week    Comment: occasional   Drug use: No   Sexual activity: Not on file  Other Topics Concern   Not on file  Social History Narrative   Not on file   Social Drivers of Health   Financial Resource Strain: Low Risk  (04/20/2022)   Received from [facility], [facility]   Overall Financial Resource Strain (CARDIA)    Difficulty of Paying Living Expenses: Not very hard  Food Insecurity: No Food Insecurity (04/20/2022)   Received from [facility], [facility]   Hunger Vital Sign    Worried About Running Out of Food in the Last Year: Never true    Ran Out of Food in the Last Year: Never true  Transportation Needs: No Transportation Needs (04/20/2022)   [facility] from [facility], [facility]   [facility] - Transportation    Lack of Transportation (Medical): No    Lack of Transportation (Non-Medical): No  Physical Activity: Not on file  Stress: Not on file  Social Connections: Not on file  Intimate Partner Violence: Not on file    Review of Systems  Constitutional:  Negative for chills and fever.  Respiratory:  Negative for shortness of breath.   Cardiovascular:  Negative for chest pain.  Gastrointestinal:  Positive for heartburn. Negative for abdominal pain, constipation, diarrhea, nausea and vomiting.  Genitourinary:  Negative for dysuria, frequency and urgency.  Neurological:  Negative for dizziness and headaches.  Endo/Heme/Allergies:  Negative for polydipsia.  Psychiatric/Behavioral:  Positive for depression. Negative for suicidal ideas. The patient is not nervous/anxious.         Objective    BP 124/72   Pulse 82   Temp 98.2 F (36.8 C)   Resp 18   Ht 5' 3 (1.6 m)   Wt 170 lb 8 oz (77.3 kg)   LMP 01/15/2018   SpO2 100%   BMI 30.20 kg/m   Physical Exam Vitals and nursing note reviewed.  Constitutional:      Appearance: Normal appearance.  Cardiovascular:     Rate and Rhythm:  Normal rate and regular rhythm.     Pulses: Normal pulses.     Heart sounds: Normal heart sounds.  Pulmonary:     Effort: Pulmonary effort is normal.     Breath sounds: Normal breath sounds.  Abdominal:     General: Bowel sounds are normal.  Neurological:     Mental Status: She is alert and oriented to person, place, and time.  Psychiatric:        Mood and Affect: Mood normal.  Behavior: Behavior normal.        Thought Content: Thought content normal.        Judgment: Judgment normal.         Assessment & Plan:  Moderate recurrent major depression (HCC) Assessment & Plan: Controlled.   Continue Cymbalta .   Consider therapy referral. She will update if she is interested.      09/07/2023   12:09 PM 06/24/2021   12:26 PM 09/03/2020   11:32 AM 05/13/2020    3:48 PM 11/14/2019    1:42 PM  Depression screen PHQ 2/9  Decreased Interest 1 0 2  3  Down, Depressed, Hopeless 1 0 1 1 2   PHQ - 2 Score 2 0 3 1 5   Altered sleeping 0 0 0  2  Tired, decreased energy 2 0 3  3  Change in appetite 1 0 1  0  Feeling bad or failure about yourself  1 0 1  3  Trouble concentrating 0 0 0  0  Moving slowly or fidgety/restless 0 0 0  1  Suicidal thoughts 1 0 0  2  PHQ-9 Score 7 0 8  16  Difficult doing work/chores Somewhat difficult  Somewhat difficult  Very difficult      Anxiety and depression Assessment & Plan: Controlled.  Continue Duloxetine .   She will think about therapy and let me know.    Hyperlipidemia, unspecified hyperlipidemia type Assessment & Plan: Uncontrolled.   Last lipid panel was in April, 2024.   She recently started a new supplement that is suppose to help lower cholesterol.   Will check lipid in April.    Moderate persistent asthma, unspecified whether complicated Assessment & Plan: Controlled.    Establishing care with new doctor, encounter for Assessment & Plan: EMR reviewed briefly.    Dysphagia, unspecified type Assessment &  Plan: Unclear etiology.   Differentials include GERD, esophageal varices.   Discussed with patient to continue the log and write down trigger foods or liquids that could be contributing.   She would like to hold off on a PPI right now.   Will continue to monitor.      Return in about 3 months (around 12/06/2023) for physical  and fasting labs.SABRA Carrol [place], NP

## 2023-09-07 NOTE — Patient Instructions (Addendum)
 Continue to keep your log and we will discuss more at your next visit about the swallowing.  Schedule Mammogram.  Schedule physical and fasting labs.   It was a pleasure meeting you!

## 2023-09-07 NOTE — Assessment & Plan Note (Signed)
 Controlled.  Continue Duloxetine.   She will think about therapy and let me know.

## 2023-09-07 NOTE — Assessment & Plan Note (Addendum)
 Controlled.   Continue Cymbalta .   Consider therapy referral. She will update if she is interested.      09/07/2023   12:09 PM 06/24/2021   12:26 PM 09/03/2020   11:32 AM 05/13/2020    3:48 PM 11/14/2019    1:42 PM  Depression screen PHQ 2/9  Decreased Interest 1 0 2  3  Down, Depressed, Hopeless 1 0 1 1 2   PHQ - 2 Score 2 0 3 1 5   Altered sleeping 0 0 0  2  Tired, decreased energy 2 0 3  3  Change in appetite 1 0 1  0  Feeling bad or failure about yourself  1 0 1  3  Trouble concentrating 0 0 0  0  Moving slowly or fidgety/restless 0 0 0  1  Suicidal thoughts 1 0 0  2  PHQ-9 Score 7 0 8  16  Difficult doing work/chores Somewhat difficult  Somewhat difficult  Very difficult

## 2023-12-06 ENCOUNTER — Encounter: Payer: Self-pay | Admitting: General Practice

## 2023-12-06 ENCOUNTER — Other Ambulatory Visit: Payer: Self-pay | Admitting: General Practice

## 2023-12-06 ENCOUNTER — Ambulatory Visit (INDEPENDENT_AMBULATORY_CARE_PROVIDER_SITE_OTHER): Payer: No Typology Code available for payment source | Admitting: General Practice

## 2023-12-06 VITALS — BP 132/80 | HR 69 | Temp 98.6°F | Ht 63.0 in | Wt 165.0 lb

## 2023-12-06 DIAGNOSIS — E559 Vitamin D deficiency, unspecified: Secondary | ICD-10-CM

## 2023-12-06 DIAGNOSIS — F419 Anxiety disorder, unspecified: Secondary | ICD-10-CM

## 2023-12-06 DIAGNOSIS — E782 Mixed hyperlipidemia: Secondary | ICD-10-CM

## 2023-12-06 DIAGNOSIS — Z7689 Persons encountering health services in other specified circumstances: Secondary | ICD-10-CM

## 2023-12-06 DIAGNOSIS — J4541 Moderate persistent asthma with (acute) exacerbation: Secondary | ICD-10-CM

## 2023-12-06 DIAGNOSIS — F331 Major depressive disorder, recurrent, moderate: Secondary | ICD-10-CM

## 2023-12-06 DIAGNOSIS — Z0000 Encounter for general adult medical examination without abnormal findings: Secondary | ICD-10-CM

## 2023-12-06 LAB — COMPREHENSIVE METABOLIC PANEL WITH GFR
ALT: 18 U/L (ref 0–35)
AST: 14 U/L (ref 0–37)
Albumin: 4.7 g/dL (ref 3.5–5.2)
Alkaline Phosphatase: 92 U/L (ref 39–117)
BUN: 13 mg/dL (ref 6–23)
CO2: 30 meq/L (ref 19–32)
Calcium: 9.7 mg/dL (ref 8.4–10.5)
Chloride: 102 meq/L (ref 96–112)
Creatinine, Ser: 0.77 mg/dL (ref 0.40–1.20)
GFR: 86.52 mL/min (ref >60.00)
Glucose, Bld: 92 mg/dL (ref 70–99)
Potassium: 4.2 meq/L (ref 3.5–5.1)
Sodium: 141 meq/L (ref 135–145)
Total Bilirubin: 0.5 mg/dL (ref 0.2–1.2)
Total Protein: 7.6 g/dL (ref 6.0–8.3)

## 2023-12-06 LAB — CBC
HCT: 42.2 % (ref 36.0–46.0)
Hemoglobin: 13.9 g/dL (ref 12.0–15.0)
MCHC: 33 g/dL (ref 30.0–36.0)
MCV: 89.8 fl (ref 78.0–100.0)
Platelets: 382 10*3/uL (ref 150.0–400.0)
RBC: 4.69 Mil/uL (ref 3.87–5.11)
RDW: 14.1 % (ref 11.5–15.5)
WBC: 8.2 10*3/uL (ref 4.0–10.5)

## 2023-12-06 LAB — VITAMIN D 25 HYDROXY (VIT D DEFICIENCY, FRACTURES): VITD: 26.39 ng/mL — ABNORMAL LOW (ref 30.00–100.00)

## 2023-12-06 LAB — LIPID PANEL
Cholesterol: 256 mg/dL — ABNORMAL HIGH (ref 0–200)
HDL: 42 mg/dL (ref >39.00)
LDL Cholesterol: 177 mg/dL — ABNORMAL HIGH (ref 0–99)
NonHDL: 214.31
Total CHOL/HDL Ratio: 6
Triglycerides: 186 mg/dL — ABNORMAL HIGH (ref 0.0–149.0)
VLDL: 37.2 mg/dL (ref 0.0–40.0)

## 2023-12-06 LAB — TSH: TSH: 3.44 u[IU]/mL (ref 0.35–5.50)

## 2023-12-06 LAB — HEMOGLOBIN A1C: Hgb A1c MFr Bld: 5.9 % (ref 4.6–6.5)

## 2023-12-06 MED ORDER — VITAMIN D (ERGOCALCIFEROL) 1.25 MG (50000 UNIT) PO CAPS: 50000.0000 [IU] | ORAL_CAPSULE | ORAL | 0 refills | Status: AC

## 2023-12-06 NOTE — Assessment & Plan Note (Signed)
Controlled.   Continue cymbalta

## 2023-12-06 NOTE — Progress Notes (Signed)
 Established Patient Office Visit  Subjective   Patient ID: Karen Martinez, female    DOB: May 31, 1966  Age: 58 y.o. MRN: [id number]  Chief Complaint  Patient presents with   Annual Exam    HPI  Karen Martinez is a 58 year old female with past medical history of asthma, anxiety, depression and HLD presents today for complete physical and follow up of chronic conditions.  Immunizations: -Tetanus: Completed in 2021 -Influenza: completed this season. -Shingles: Completed Shingrix series -Pneumonia: due (increased risk due to Asthma)  Diet: Fair diet.  Exercise: No regular exercise.  Eye exam: Completes annually  Dental exam: Completed several years ago.  Pap Smear: Completed in 2024 Mammogram: Completed in 2024  Colonoscopy: Completed in 2023  Anxiety and depression: currently managed on Cymbalta 30 mg once daily. Feels like she is doing well on it. Her dad passed away two weeks ago. She just came back home and is not trying to focus back on her health.   Patient Active Problem List   Diagnosis Date Noted   Encounter for general adult medical examination w/o abnormal findings 12/06/2023   Moderate recurrent major depression (HCC) 09/07/2023   Dysphagia 09/07/2023   Hyperlipidemia 01/03/2020   Moderate persistent asthma 08/08/2015   Anxiety and depression 08/08/2015   Vitamin D deficiency 08/08/2015   Past Medical History:  Diagnosis Date   Allergy    Asthma    Closed fracture of distal phalanx of great toe 06/25/2021   Depression    Emphysema of lung (HCC)    Heart murmur    heart arrhythmias   Hyperlipidemia    Inflammatory pain 07/21/2021   Thyroid disease    Urine incontinence    Past Surgical History:  Procedure Laterality Date   BREAST SURGERY  2011   breast biopsy----cyst drainage on breast   Allergies  Allergen Reactions   Keflex [Cephalexin] Hives   Atorvastatin Other (See Comments)    Leg cramps   Ezetimibe     Itching and skin bruise    Fluoxetine Other (See Comments)    Tired and unable to stay awake   Penicillins    Poison Oak Extract [Poison Oak Extract]    Sulfa Antibiotics     Other reaction(s): Other (See Comments)  Other Reaction: Possible rash at end of course  Other reaction(s): Other (See Comments) Other Reaction: Possible rash at end of course         12/06/2023    8:18 AM 09/07/2023   12:09 PM 06/24/2021   12:26 PM  Depression screen PHQ 2/9  Decreased Interest 1 1 0  Down, Depressed, Hopeless 1 1 0  PHQ - 2 Score 2 2 0  Altered sleeping 0 0 0  Tired, decreased energy 3 2 0  Change in appetite 2 1 0  Feeling bad or failure about yourself  1 1 0  Trouble concentrating 0 0 0  Moving slowly or fidgety/restless 0 0 0  Suicidal thoughts 0 1 0  PHQ-9 Score 8 7 0  Difficult doing work/chores Somewhat difficult Somewhat difficult        12/06/2023    8:18 AM 09/07/2023   12:10 PM 09/03/2020   11:32 AM 05/13/2020    3:50 PM  GAD 7 : Generalized Anxiety Score  Nervous, Anxious, on Edge 1 1 2  0  Control/stop worrying 3 2 2 1   Worry too much - different things 1 2 2 1   Trouble relaxing 1 0 1 1  Restless 0 0 0 0  Easily annoyed or irritable 0 0 1 2  Afraid - awful might happen 2 1 0 0  Total GAD 7 Score 8 6 8 5   Anxiety Difficulty Somewhat difficult Somewhat difficult Somewhat difficult Not difficult at all      Review of Systems  Constitutional:  Negative for chills, fever, malaise/fatigue and weight loss.  HENT:  Negative for congestion, ear discharge, ear pain, hearing loss, nosebleeds, sinus pain, sore throat and tinnitus.   Eyes:  Negative for blurred vision, double vision, pain, discharge and redness.  Respiratory:  Negative for cough, shortness of breath, wheezing and stridor.   Cardiovascular:  Negative for chest pain, palpitations and leg swelling.  Gastrointestinal:  Negative for abdominal pain, constipation, diarrhea, heartburn, nausea and vomiting.  Genitourinary:  Negative for dysuria,  frequency and urgency.  Musculoskeletal:  Negative for myalgias.  Skin:  Negative for rash.  Neurological:  Negative for dizziness, tingling, seizures, weakness and headaches.  Psychiatric/Behavioral:  Negative for depression, substance abuse and suicidal ideas. The patient is not nervous/anxious.       Objective:     BP 132/80 (BP Location: Left Arm, Patient Position: Sitting, Cuff Size: Normal)   Pulse 69   Temp 98.6 F (37 C) (Oral)   Ht 5\' 3"  (1.6 m)   Wt 165 lb (74.8 kg)   LMP 01/15/2018   SpO2 98%   BMI 29.23 kg/m  BP Readings from Last 3 Encounters:  12/06/23 132/80  09/07/23 124/72  06/24/21 126/72   Wt Readings from Last 3 Encounters:  12/06/23 165 lb (74.8 kg)  09/07/23 170 lb 8 oz (77.3 kg)  06/24/21 161 lb 6.4 oz (73.2 kg)      Physical Exam Vitals and nursing note reviewed.  Constitutional:      Appearance: Normal appearance.  HENT:     Head: Normocephalic and atraumatic.     Right Ear: Tympanic membrane, ear canal and external ear normal.     Left Ear: Tympanic membrane, ear canal and external ear normal.     Nose: Nose normal.     Mouth/Throat:     Mouth: Mucous membranes are moist.     Pharynx: Oropharynx is clear.  Eyes:     Conjunctiva/sclera: Conjunctivae normal.     Pupils: Pupils are equal, round, and reactive to light.  Cardiovascular:     Rate and Rhythm: Normal rate and regular rhythm.     Pulses: Normal pulses.     Heart sounds: Normal heart sounds.  Pulmonary:     Effort: Pulmonary effort is normal.     Breath sounds: Normal breath sounds.  Abdominal:     General: Abdomen is flat. Bowel sounds are normal.     Palpations: Abdomen is soft.  Musculoskeletal:        General: Normal range of motion.     Cervical back: Normal range of motion.  Skin:    General: Skin is warm and dry.     Capillary Refill: Capillary refill takes less than 2 seconds.  Neurological:     General: No focal deficit present.     Mental Status: She is alert  and oriented to person, place, and time. Mental status is at baseline.  Psychiatric:        Mood and Affect: Mood normal.        Behavior: Behavior normal.        Thought Content: Thought content normal.        Judgment:  Judgment normal.      No results found for any visits on 12/06/23.     The 10-year ASCVD risk score (Arnett DK, et al., 2019) is: 3.5%    Assessment & Plan:  Encounter for general adult medical examination w/o abnormal findings Assessment & Plan: Immunizations UTD. She will think about the pneumonia vaccine.  Pap smear UTD. Mammogram scheduled. Colonoscopy UTD,   Discussed the importance of a healthy diet and regular exercise in order for weight loss, and to reduce the risk of further co-morbidity.  Exam stable. Labs pending.  Follow up in 1 year for repeat physical.   Orders: -     CBC -     Comprehensive metabolic panel with GFR -     Lipid panel -     TSH -     Hemoglobin A1c  Vitamin D deficiency -     VITAMIN D 25 Hydroxy (Vit-D Deficiency, Fractures)  Mixed hyperlipidemia Assessment & Plan: Repeat lipid panel pending.   Discussed the importance of eating a low fat, low cholesterol diet.   Orders: -     Comprehensive metabolic panel with GFR -     Lipid panel -     TSH -     Hemoglobin A1c  Moderate recurrent major depression (HCC) Assessment & Plan: Controlled.   Continue Cymbalta.    Moderate persistent asthma with acute exacerbation Assessment & Plan: Controlled.   Anxiety and depression Assessment & Plan: Controlled.  Continue cymbalta.     Return in about 1 year (around 12/05/2024) for physical.    Modesto Charon, NP

## 2023-12-06 NOTE — Assessment & Plan Note (Signed)
 Repeat lipid panel pending.   Discussed the importance of eating a low fat, low cholesterol diet.

## 2023-12-06 NOTE — Patient Instructions (Signed)
 Stop by the lab prior to leaving today. I will notify you of your results once received.   Follow up in one year for physical.   It was a pleasure to see you today!

## 2023-12-06 NOTE — Assessment & Plan Note (Signed)
Controlled Continue Cymbalta

## 2023-12-06 NOTE — Assessment & Plan Note (Addendum)
 Immunizations UTD. She will think about the pneumonia vaccine.  Pap smear UTD. Mammogram scheduled. Colonoscopy UTD,   Discussed the importance of a healthy diet and regular exercise in order for weight loss, and to reduce the risk of further co-morbidity.  Exam stable. Labs pending.  Follow up in 1 year for repeat physical.

## 2023-12-06 NOTE — Assessment & Plan Note (Signed)
 Controlled.

## 2024-01-24 ENCOUNTER — Encounter: Payer: Self-pay | Admitting: General Practice

## 2024-02-03 ENCOUNTER — Encounter: Payer: Self-pay | Admitting: General Practice

## 2024-02-08 ENCOUNTER — Encounter: Payer: Self-pay | Admitting: General Practice

## 2024-02-08 ENCOUNTER — Ambulatory Visit (INDEPENDENT_AMBULATORY_CARE_PROVIDER_SITE_OTHER): Admitting: General Practice

## 2024-02-08 VITALS — BP 118/76 | HR 80 | Temp 99.4°F | Ht 63.0 in | Wt 167.0 lb

## 2024-02-08 DIAGNOSIS — F419 Anxiety disorder, unspecified: Secondary | ICD-10-CM

## 2024-02-08 DIAGNOSIS — R110 Nausea: Secondary | ICD-10-CM | POA: Diagnosis not present

## 2024-02-08 DIAGNOSIS — F331 Major depressive disorder, recurrent, moderate: Secondary | ICD-10-CM

## 2024-02-08 MED ORDER — SCOPOLAMINE 1 MG/3DAYS TD PT72: 1.0000 | MEDICATED_PATCH | TRANSDERMAL | 0 refills | Status: AC

## 2024-02-08 MED ORDER — ONDANSETRON 4 MG PO TBDP
4.0000 mg | ORAL_TABLET | Freq: Three times a day (TID) | ORAL | 0 refills | Status: AC | PRN
Start: 2024-02-08 — End: ?

## 2024-02-08 MED ORDER — DULOXETINE HCL 20 MG PO CPEP: 20.0000 mg | ORAL_CAPSULE | Freq: Every day | ORAL | 0 refills | Status: DC

## 2024-02-08 NOTE — Progress Notes (Signed)
 Established Patient Office Visit  Subjective   Patient ID: Karen Martinez, female    DOB: 03-06-1966  Age: 58 y.o. MRN: [id number]  Chief Complaint  Patient presents with   Medication Issue    Pt thinks she is having trouble with the duloxetine . No emotion. Libido is really down.     HPI  Karen Martinez is a 58 year old female with past medical history of asthma, anxiety, depression, HLD presents today an acute visit.   Anxiety and depression: currently managed on Duloxetine  30 mg once daily. She reports that her medication that her medication is not effective anymore. She reports no emotion and decreased libido. Before she started the medication, she was suicidal all the time, worrying all the time, always on the edge and was always angry. When she started Duloxetine  all of her symptoms improved drastically. Lately, she has had decreased emotion and libido. Overall, medication has been helping her anxiety and depression and pain but due to the sexual side effects she would like to try another option. She denies SI/HI.   Nausea: Patient reports that she will be going out of town on a cruise later this month and usually gets motion sickness and nausea.  She would like a prescription of scopolamine patches and some Zofran to use as needed.  Patient Active Problem List   Diagnosis Date Noted   Nausea 02/08/2024   Encounter for general adult medical examination w/o abnormal findings 12/06/2023   Moderate recurrent major depression (HCC) 09/07/2023   Dysphagia 09/07/2023   Hyperlipidemia 01/03/2020   Moderate persistent asthma 08/08/2015   Anxiety and depression 08/08/2015   Vitamin D  deficiency 08/08/2015   Past Medical History:  Diagnosis Date   Allergy    Asthma    Closed fracture of distal phalanx of great toe 06/25/2021   Depression    Emphysema of lung (HCC)    Heart murmur    heart arrhythmias   Hyperlipidemia    Inflammatory pain 07/21/2021   Thyroid  disease    Urine  incontinence    Past Surgical History:  Procedure Laterality Date   BREAST SURGERY  2011   breast biopsy----cyst drainage on breast   Allergies  Allergen Reactions   Keflex [Cephalexin] Hives   Atorvastatin  Other (See Comments)    Leg cramps   Ezetimibe     Itching and skin bruise   Fluoxetine  Other (See Comments)    Tired and unable to stay awake   Penicillins    Poison Oak Extract [Poison Oak Extract]    Sulfa Antibiotics     Other reaction(s): Other (See Comments)  Other Reaction: Possible rash at end of course  Other reaction(s): Other (See Comments) Other Reaction: Possible rash at end of course         02/08/2024   10:24 AM 12/06/2023    8:18 AM 09/07/2023   12:09 PM  Depression screen PHQ 2/9  Decreased Interest 2 1 1   Down, Depressed, Hopeless 0 1 1  PHQ - 2 Score 2 2 2   Altered sleeping 1 0 0  Tired, decreased energy 3 3 2   Change in appetite 1 2 1   Feeling bad or failure about yourself  0 1 1  Trouble concentrating 3 0 0  Moving slowly or fidgety/restless 1 0 0  Suicidal thoughts 0 0 1  PHQ-9 Score 11 8 7   Difficult doing work/chores  Somewhat difficult Somewhat difficult       12/06/2023    8:18 AM  09/07/2023   12:10 PM 09/03/2020   11:32 AM 05/13/2020    3:50 PM  GAD 7 : Generalized Anxiety Score  Nervous, Anxious, on Edge 1 1 2  0  Control/stop worrying 3 2 2 1   Worry too much - different things 1 2 2 1   Trouble relaxing 1 0 1 1  Restless 0 0 0 0  Easily annoyed or irritable 0 0 1 2  Afraid - awful might happen 2 1 0 0  Total GAD 7 Score 8 6 8 5   Anxiety Difficulty Somewhat difficult Somewhat difficult Somewhat difficult Not difficult at all      Review of Systems  Constitutional:  Negative for chills and fever.  Respiratory:  Negative for shortness of breath.   Cardiovascular:  Negative for chest pain.  Gastrointestinal:  Negative for abdominal pain, constipation, diarrhea, heartburn, nausea and vomiting.  Genitourinary:  Negative for dysuria,  frequency and urgency.  Neurological:  Negative for dizziness and headaches.  Endo/Heme/Allergies:  Negative for polydipsia.  Psychiatric/Behavioral:  Negative for depression and suicidal ideas. The patient is nervous/anxious.       Objective:     BP 118/76 (BP Location: Right Arm, Patient Position: Sitting, Cuff Size: Large)   Pulse 80   Temp 99.4 F (37.4 C) (Oral)   Ht 5\' 3"  (1.6 m)   Wt 167 lb (75.8 kg)   LMP 01/15/2018   SpO2 95%   BMI 29.58 kg/m  BP Readings from Last 3 Encounters:  02/08/24 118/76  12/06/23 132/80  09/07/23 124/72   Wt Readings from Last 3 Encounters:  02/08/24 167 lb (75.8 kg)  12/06/23 165 lb (74.8 kg)  09/07/23 170 lb 8 oz (77.3 kg)      Physical Exam Vitals and nursing note reviewed.  Constitutional:      Appearance: Normal appearance.  Cardiovascular:     Rate and Rhythm: Normal rate and regular rhythm.     Pulses: Normal pulses.     Heart sounds: Normal heart sounds.  Pulmonary:     Effort: Pulmonary effort is normal.     Breath sounds: Normal breath sounds.  Neurological:     Mental Status: She is alert and oriented to person, place, and time.  Psychiatric:        Mood and Affect: Mood normal.        Behavior: Behavior normal.        Thought Content: Thought content normal.        Judgment: Judgment normal.      No results found for any visits on 02/08/24.     The 10-year ASCVD risk score (Arnett DK, et al., 2019) is: 3%    Assessment & Plan:  Moderate recurrent major depression (HCC) Assessment & Plan: Controlled.  Suspect that some of her symptoms could be secondary to Cymbalta .  Given that she has failed multiple treatments for anxiety and depression and Cymbalta  has controlled her anxiety and depression.  She is agreeable to decrease the dosage and see if her side effects go away.  She denies SI/HI Rx sent for Cymbalta  20 mg once daily. Consider psychiatry referral.  Follow-up in 8 weeks.  Orders: -      DULoxetine  HCl; Take 1 capsule (20 mg total) by mouth daily.  Dispense: 90 capsule; Refill: 0  Anxiety and depression Assessment & Plan: Controlled.  Suspect that some of her symptoms could be secondary to Cymbalta .  Given that she has failed multiple treatments for anxiety and depression and Cymbalta   has controlled her anxiety and depression.  She is agreeable to decrease the dosage and see if her side effects go away.  She denies SI/HI Rx sent for Cymbalta  20 mg once daily. Consider psychiatry referral.  Follow-up in 8 weeks.  Orders: -     DULoxetine  HCl; Take 1 capsule (20 mg total) by mouth daily.  Dispense: 90 capsule; Refill: 0  Nausea Assessment & Plan: Rx sent for Zofran 4 mg every 8 hours as needed. Rx sent for scopolamine 1.5 mg every 3 days as needed.  Orders: -     Ondansetron; Take 1 tablet (4 mg total) by mouth every 8 (eight) hours as needed.  Dispense: 20 tablet; Refill: 0 -     Scopolamine; Place 1 patch (1.5 mg total) onto the skin every 3 (three) days.  Dispense: 5 patch; Refill: 0     Return in about 2 months (around 04/09/2024) for depression .    Jolanda Nation, NP

## 2024-02-08 NOTE — Patient Instructions (Signed)
 Start Cymbalta  20 mg once daily.   Follow up in 8 weeks.   It was a pleasure to see you today!

## 2024-02-08 NOTE — Assessment & Plan Note (Signed)
 Controlled.  Suspect that some of her symptoms could be secondary to Cymbalta .  Given that she has failed multiple treatments for anxiety and depression and Cymbalta  has controlled her anxiety and depression.  She is agreeable to decrease the dosage and see if her side effects go away.  She denies SI/HI Rx sent for Cymbalta  20 mg once daily. Consider psychiatry referral.  Follow-up in 8 weeks.

## 2024-02-08 NOTE — Assessment & Plan Note (Signed)
 Rx sent for Zofran 4 mg every 8 hours as needed. Rx sent for scopolamine 1.5 mg every 3 days as needed.

## 2024-02-08 NOTE — Assessment & Plan Note (Addendum)
 Controlled.  Suspect that some of her symptoms could be secondary to Cymbalta .  Given that she has failed multiple treatments for anxiety and depression and Cymbalta  has controlled her anxiety and depression.  She is agreeable to decrease the dosage and see if her side effects go away.  She denies SI/HI Rx sent for Cymbalta  20 mg once daily. Consider psychiatry referral.  Follow-up in 8 weeks.

## 2024-02-26 ENCOUNTER — Other Ambulatory Visit: Payer: Self-pay | Admitting: General Practice

## 2024-02-26 DIAGNOSIS — E559 Vitamin D deficiency, unspecified: Secondary | ICD-10-CM

## 2024-03-06 ENCOUNTER — Ambulatory Visit: Payer: Self-pay | Admitting: Nurse Practitioner

## 2024-03-06 ENCOUNTER — Other Ambulatory Visit (INDEPENDENT_AMBULATORY_CARE_PROVIDER_SITE_OTHER)

## 2024-03-06 DIAGNOSIS — E559 Vitamin D deficiency, unspecified: Secondary | ICD-10-CM | POA: Diagnosis not present

## 2024-03-06 LAB — VITAMIN D 25 HYDROXY (VIT D DEFICIENCY, FRACTURES): VITD: 34.99 ng/mL (ref 30.00–100.00)

## 2024-04-10 ENCOUNTER — Ambulatory Visit: Admitting: General Practice

## 2024-04-12 ENCOUNTER — Encounter: Payer: Self-pay | Admitting: General Practice

## 2024-05-04 ENCOUNTER — Other Ambulatory Visit: Payer: Self-pay | Admitting: General Practice

## 2024-05-04 DIAGNOSIS — F331 Major depressive disorder, recurrent, moderate: Secondary | ICD-10-CM

## 2024-05-04 DIAGNOSIS — F32A Depression, unspecified: Secondary | ICD-10-CM

## 2024-06-07 ENCOUNTER — Ambulatory Visit (INDEPENDENT_AMBULATORY_CARE_PROVIDER_SITE_OTHER): Admitting: General Practice

## 2024-06-07 ENCOUNTER — Encounter: Payer: Self-pay | Admitting: General Practice

## 2024-06-07 ENCOUNTER — Ambulatory Visit: Payer: Self-pay | Admitting: General Practice

## 2024-06-07 VITALS — BP 124/86 | HR 66 | Temp 98.2°F | Ht 63.0 in | Wt 164.0 lb

## 2024-06-07 DIAGNOSIS — R0789 Other chest pain: Secondary | ICD-10-CM | POA: Diagnosis not present

## 2024-06-07 DIAGNOSIS — K047 Periapical abscess without sinus: Secondary | ICD-10-CM

## 2024-06-07 DIAGNOSIS — B379 Candidiasis, unspecified: Secondary | ICD-10-CM

## 2024-06-07 DIAGNOSIS — T3695XA Adverse effect of unspecified systemic antibiotic, initial encounter: Secondary | ICD-10-CM

## 2024-06-07 DIAGNOSIS — R002 Palpitations: Secondary | ICD-10-CM | POA: Diagnosis not present

## 2024-06-07 LAB — CBC WITH DIFFERENTIAL/PLATELET
Basophils Absolute: 0.1 K/uL (ref 0.0–0.1)
Basophils Relative: 1.2 % (ref 0.0–3.0)
Eosinophils Absolute: 0.3 K/uL (ref 0.0–0.7)
Eosinophils Relative: 3.8 % (ref 0.0–5.0)
HCT: 40.3 % (ref 36.0–46.0)
Hemoglobin: 13.1 g/dL (ref 12.0–15.0)
Lymphocytes Relative: 33.1 % (ref 12.0–46.0)
Lymphs Abs: 2.4 K/uL (ref 0.7–4.0)
MCHC: 32.6 g/dL (ref 30.0–36.0)
MCV: 87.6 fl (ref 78.0–100.0)
Monocytes Absolute: 0.5 K/uL (ref 0.1–1.0)
Monocytes Relative: 6.5 % (ref 3.0–12.0)
Neutro Abs: 4 K/uL (ref 1.4–7.7)
Neutrophils Relative %: 55.4 % (ref 43.0–77.0)
Platelets: 360 K/uL (ref 150.0–400.0)
RBC: 4.6 Mil/uL (ref 3.87–5.11)
RDW: 14 % (ref 11.5–15.5)
WBC: 7.2 K/uL (ref 4.0–10.5)

## 2024-06-07 LAB — COMPREHENSIVE METABOLIC PANEL WITH GFR
ALT: 19 U/L (ref 0–35)
AST: 17 U/L (ref 0–37)
Albumin: 4.5 g/dL (ref 3.5–5.2)
Alkaline Phosphatase: 73 U/L (ref 39–117)
BUN: 18 mg/dL (ref 6–23)
CO2: 31 meq/L (ref 19–32)
Calcium: 9.6 mg/dL (ref 8.4–10.5)
Chloride: 102 meq/L (ref 96–112)
Creatinine, Ser: 0.75 mg/dL (ref 0.40–1.20)
GFR: 88.98 mL/min (ref >60.00)
Glucose, Bld: 89 mg/dL (ref 70–99)
Potassium: 4.7 meq/L (ref 3.5–5.1)
Sodium: 141 meq/L (ref 135–145)
Total Bilirubin: 0.3 mg/dL (ref 0.2–1.2)
Total Protein: 7.8 g/dL (ref 6.0–8.3)

## 2024-06-07 LAB — TSH: TSH: 2.89 u[IU]/mL (ref 0.35–5.50)

## 2024-06-07 LAB — TROPONIN I (HIGH SENSITIVITY): High Sens Troponin I: 5 ng/L (ref 2–17)

## 2024-06-07 MED ORDER — FLUCONAZOLE 150 MG PO TABS: 150.0000 mg | ORAL_TABLET | Freq: Once | ORAL | 0 refills | Status: AC

## 2024-06-07 NOTE — Progress Notes (Signed)
 Established Patient Office Visit  Subjective   Patient ID: Karen Martinez, female    DOB: March 09, 1966  Age: 58 y.o. MRN: [id number]  Chief Complaint  Patient presents with   Palpitations    Off and on for a week; patient has had these in the past. Patient states it will start around 1 pm and can continue until she goes to bed. States she also feels some pressure in the middle of her chest like a gas bubble.    Vaginitis    Does not have one yet but is on an abx and wants diflucan  sent in today as she usually gets one when she's on an abx.     Palpitations  Associated symptoms include dizziness and malaise/fatigue. Pertinent negatives include no anxiety, chest pain, fever, nausea, shortness of breath or vomiting.    Karen Martinez is a 58 year old female with past medical history of asthma, anxiety, depression, HLD, presents today for an acute visit to discuss palpitations.   Discussed the use of AI scribe software for clinical note transcription with the patient, who gave verbal consent to proceed.  History of Present Illness Karen Martinez is a 58 year old female who presents with palpitations and chest pressure.  She has been experiencing intermittent palpitations for the past week, a symptom she has had in the past but not for many years. Previously, a cardiac workup was normal. The palpitations typically begin in the early afternoon and persist throughout the day, but are absent upon waking. She describes a sensation of pressure in the middle of her chest, similar to a gas bubble, but denies significant chest pain.  She recently developed a tooth abscess that began on Friday, for which she started clindamycin on Monday after visiting the dentist. She felt better the same day but notes some residual swelling. She is also taking naproxen and is currently on the antibiotic. She reports that she does get yeast infection with the antibiotic and would like diflucan .   She reports slight  dizziness and tiredness, which she attributes to the antibiotic. No fever, chills, shortness of breath, or significant headache, although she did experience a slight headache that resolved quickly. She feels tired enough to want to return to bed.   Patient Active Problem List   Diagnosis Date Noted   Nausea 02/08/2024   Encounter for general adult medical examination w/o abnormal findings 12/06/2023   Moderate recurrent major depression (HCC) 09/07/2023   Dysphagia 09/07/2023   Hyperlipidemia 01/03/2020   Moderate persistent asthma 08/08/2015   Anxiety and depression 08/08/2015   Vitamin D  deficiency 08/08/2015   Past Medical History:  Diagnosis Date   Allergy    Asthma    Closed fracture of distal phalanx of great toe 06/25/2021   Depression    Emphysema of lung (HCC)    Heart murmur    heart arrhythmias   Hyperlipidemia    Inflammatory pain 07/21/2021   Thyroid  disease    Urine incontinence    Past Surgical History:  Procedure Laterality Date   BREAST SURGERY  2011   breast biopsy----cyst drainage on breast   Allergies  Allergen Reactions   Keflex [Cephalexin] Hives   Atorvastatin  Other (See Comments)    Leg cramps   Ezetimibe     Itching and skin bruise   Fluoxetine  Other (See Comments)    Tired and unable to stay awake   Penicillins    Poison Oak Extract [Poison Oak Extract]  Sulfa Antibiotics     Other reaction(s): Other (See Comments)  Other Reaction: Possible rash at end of course  Other reaction(s): Other (See Comments) Other Reaction: Possible rash at end of course         06/07/2024   11:32 AM 02/08/2024   10:24 AM 12/06/2023    8:18 AM  Depression screen PHQ 2/9  Decreased Interest 2 2 1   Down, Depressed, Hopeless 0 0 1  PHQ - 2 Score 2 2 2   Altered sleeping 0 1 0  Tired, decreased energy 0 3 3  Change in appetite 0 1 2  Feeling bad or failure about yourself  0 0 1  Trouble concentrating 0 3 0  Moving slowly or fidgety/restless 0 1 0   Suicidal thoughts 0 0 0  PHQ-9 Score 2 11 8   Difficult doing work/chores Not difficult at all  Somewhat difficult       06/07/2024   11:33 AM 12/06/2023    8:18 AM 09/07/2023   12:10 PM 09/03/2020   11:32 AM  GAD 7 : Generalized Anxiety Score  Nervous, Anxious, on Edge 0 1 1 2   Control/stop worrying 0 3 2 2   Worry too much - different things 0 1 2 2   Trouble relaxing 0 1 0 1  Restless 0 0 0 0  Easily annoyed or irritable 0 0 0 1  Afraid - awful might happen 0 2 1 0  Total GAD 7 Score 0 8 6 8   Anxiety Difficulty Not difficult at all Somewhat difficult Somewhat difficult Somewhat difficult      Review of Systems  Constitutional:  Positive for malaise/fatigue. Negative for chills and fever.  Respiratory:  Negative for shortness of breath.   Cardiovascular:  Positive for palpitations. Negative for chest pain.  Gastrointestinal:  Negative for abdominal pain, constipation, diarrhea, heartburn, nausea and vomiting.  Genitourinary:  Negative for dysuria, frequency and urgency.  Neurological:  Positive for dizziness and headaches.  Endo/Heme/Allergies:  Negative for polydipsia.  Psychiatric/Behavioral:  Negative for depression and suicidal ideas. The patient is not nervous/anxious.       Objective:     BP 124/86   Pulse 66   Temp 98.2 F (36.8 C) (Oral)   Ht 5' 3 (1.6 m)   Wt 164 lb (74.4 kg)   LMP 01/15/2018   SpO2 98%   BMI 29.05 kg/m  BP Readings from Last 3 Encounters:  06/07/24 124/86  02/08/24 118/76  12/06/23 132/80   Wt Readings from Last 3 Encounters:  06/07/24 164 lb (74.4 kg)  02/08/24 167 lb (75.8 kg)  12/06/23 165 lb (74.8 kg)      Physical Exam Vitals and nursing note reviewed.  Constitutional:      Appearance: Normal appearance.  Cardiovascular:     Rate and Rhythm: Normal rate and regular rhythm.     Pulses: Normal pulses.     Heart sounds: Normal heart sounds.  Pulmonary:     Effort: Pulmonary effort is normal.     Breath sounds: Normal breath  sounds.  Neurological:     Mental Status: She is alert and oriented to person, place, and time.  Psychiatric:        Mood and Affect: Mood normal.        Behavior: Behavior normal.        Thought Content: Thought content normal.        Judgment: Judgment normal.      No results found for any visits on 06/07/24.  The 10-year ASCVD risk score (Arnett DK, et al., 2019) is: 3.4%    Assessment & Plan:  Chest pressure -     TSH -     CBC with Differential/Platelet -     Comprehensive metabolic panel with GFR -     Troponin I (High Sensitivity)  Palpitation -     TSH -     CBC with Differential/Platelet -     Comprehensive metabolic panel with GFR -     Troponin I (High Sensitivity) -     EKG 12-Lead  Antibiotic-induced yeast infection -     Fluconazole ; Take 1 tablet (150 mg total) by mouth once for 1 dose.  Dispense: 1 tablet; Refill: 0  Dental abscess    Assessment and Plan Assessment & Plan Palpitations and chest pressure Intermittent palpitations and chest pressure with normal sinus rhythm on EKG. Differential includes cardiac etiology, thyroid  dysfunction, and possible relation to dental infection. -EKG tracing is personally reviewed.  EKG notes NSR.  No acute changes.  - CBC with diff, TSH, CMP, troponin STAT pending. - ER precautions provided. - Consider cardiac referral and/or zoll monitor.  Dental abscess Dental abscess treated with clindamycin due to penicillin allergy. Swelling persists but improvement noted. Potential link to palpitations considered. - Continue clindamycin as prescribed. - Continue naproxen for pain and swelling. - RX sent for Diflucan  150 mg once.    Return if symptoms worsen or fail to improve.    Carrol Aurora, NP

## 2024-06-07 NOTE — Patient Instructions (Addendum)
 Stop by the lab prior to leaving today. I will notify you of your results once received.   Diflucan  sent to the pharmacy for the yeast infection.  Monitor symptoms and go to the ER as discussed.   I will be in touch regarding the labs.   It was a pleasure to see you today!

## 2024-09-11 ENCOUNTER — Telehealth: Payer: Self-pay | Admitting: General Practice

## 2024-09-11 NOTE — Telephone Encounter (Unsigned)
 Copied from CRM #[id number]. Topic: Appointments - Transfer of Care >> Sep 11, 2024  1:17 PM Wess RAMAN wrote: Pt is requesting to transfer FROM: Vincente Shivers, NP Pt is requesting to transfer TO: Zafirov, Clarissa, MD Reason for requested transfer: closer to home location It is the responsibility of the team the patient would like to transfer to (Zafirov, Price, MD) to reach out to the patient if for any reason this transfer is not acceptable.

## 2024-09-11 NOTE — Telephone Encounter (Signed)
Ok with TOC 

## 2024-10-03 ENCOUNTER — Encounter

## 2024-12-07 ENCOUNTER — Encounter: Admitting: General Practice

## 2024-12-13 ENCOUNTER — Encounter: Admitting: General Practice
